# Patient Record
Sex: Female | Born: 1975
Health system: Southern US, Community
[De-identification: ages and names within clinical notes are randomized; demographics above are authoritative.]

## PROBLEM LIST (undated history)

## (undated) DIAGNOSIS — L409 Psoriasis, unspecified: Secondary | ICD-10-CM

## (undated) DIAGNOSIS — E0590 Thyrotoxicosis, unspecified without thyrotoxic crisis or storm: Secondary | ICD-10-CM

## (undated) DIAGNOSIS — E049 Nontoxic goiter, unspecified: Secondary | ICD-10-CM

## (undated) DIAGNOSIS — E559 Vitamin D deficiency, unspecified: Secondary | ICD-10-CM

## (undated) DIAGNOSIS — S92901A Unspecified fracture of right foot, initial encounter for closed fracture: Secondary | ICD-10-CM

## (undated) DIAGNOSIS — O009 Unspecified ectopic pregnancy without intrauterine pregnancy: Secondary | ICD-10-CM

## (undated) DIAGNOSIS — K219 Gastro-esophageal reflux disease without esophagitis: Secondary | ICD-10-CM

## (undated) HISTORY — DX: Unspecified fracture of right foot, initial encounter for closed fracture: S92.901A

## (undated) HISTORY — DX: Vitamin D deficiency, unspecified: E55.9

## (undated) HISTORY — DX: Nontoxic goiter, unspecified: E04.9

## (undated) HISTORY — DX: Psoriasis, unspecified: L40.9

## (undated) HISTORY — PX: LAPAROSCOPY FOR ECTOPIC PREGNANCY: SUR765

## (undated) HISTORY — PX: SALPINGECTOMY: SHX328

---

## 1999-10-29 ENCOUNTER — Emergency Department (HOSPITAL_COMMUNITY): Admission: EM | Admit: 1999-10-29 | Discharge: 1999-10-29 | Payer: Self-pay | Admitting: Emergency Medicine

## 2000-03-17 ENCOUNTER — Inpatient Hospital Stay (HOSPITAL_COMMUNITY): Admission: AD | Admit: 2000-03-17 | Discharge: 2000-03-17 | Payer: Self-pay | Admitting: Obstetrics & Gynecology

## 2000-05-31 ENCOUNTER — Emergency Department (HOSPITAL_COMMUNITY): Admission: EM | Admit: 2000-05-31 | Discharge: 2000-05-31 | Payer: Self-pay | Admitting: Emergency Medicine

## 2000-12-07 ENCOUNTER — Inpatient Hospital Stay (HOSPITAL_COMMUNITY): Admission: AD | Admit: 2000-12-07 | Discharge: 2000-12-07 | Payer: Self-pay | Admitting: Obstetrics

## 2001-02-08 ENCOUNTER — Emergency Department (HOSPITAL_COMMUNITY): Admission: EM | Admit: 2001-02-08 | Discharge: 2001-02-08 | Payer: Self-pay | Admitting: Emergency Medicine

## 2001-03-26 ENCOUNTER — Emergency Department (HOSPITAL_COMMUNITY): Admission: EM | Admit: 2001-03-26 | Discharge: 2001-03-26 | Payer: Self-pay | Admitting: Emergency Medicine

## 2001-03-28 ENCOUNTER — Emergency Department (HOSPITAL_COMMUNITY): Admission: EM | Admit: 2001-03-28 | Discharge: 2001-03-28 | Payer: Self-pay | Admitting: *Deleted

## 2001-03-29 ENCOUNTER — Emergency Department (HOSPITAL_COMMUNITY): Admission: EM | Admit: 2001-03-29 | Discharge: 2001-03-29 | Payer: Self-pay | Admitting: Emergency Medicine

## 2001-03-31 ENCOUNTER — Emergency Department (HOSPITAL_COMMUNITY): Admission: EM | Admit: 2001-03-31 | Discharge: 2001-03-31 | Payer: Self-pay | Admitting: Emergency Medicine

## 2002-10-13 ENCOUNTER — Inpatient Hospital Stay (HOSPITAL_COMMUNITY): Admission: AD | Admit: 2002-10-13 | Discharge: 2002-10-13 | Payer: Self-pay | Admitting: Obstetrics and Gynecology

## 2003-02-21 ENCOUNTER — Emergency Department (HOSPITAL_COMMUNITY): Admission: EM | Admit: 2003-02-21 | Discharge: 2003-02-21 | Payer: Self-pay | Admitting: Emergency Medicine

## 2003-04-09 ENCOUNTER — Inpatient Hospital Stay (HOSPITAL_COMMUNITY): Admission: AD | Admit: 2003-04-09 | Discharge: 2003-04-09 | Payer: Self-pay | Admitting: *Deleted

## 2003-09-21 ENCOUNTER — Inpatient Hospital Stay (HOSPITAL_COMMUNITY): Admission: AD | Admit: 2003-09-21 | Discharge: 2003-09-22 | Payer: Self-pay | Admitting: *Deleted

## 2004-02-23 ENCOUNTER — Inpatient Hospital Stay (HOSPITAL_COMMUNITY): Admission: AD | Admit: 2004-02-23 | Discharge: 2004-02-23 | Payer: Self-pay | Admitting: *Deleted

## 2005-11-30 ENCOUNTER — Emergency Department (HOSPITAL_COMMUNITY): Admission: EM | Admit: 2005-11-30 | Discharge: 2005-11-30 | Payer: Self-pay | Admitting: Family Medicine

## 2006-05-02 ENCOUNTER — Emergency Department (HOSPITAL_COMMUNITY): Admission: EM | Admit: 2006-05-02 | Discharge: 2006-05-02 | Payer: Self-pay | Admitting: Family Medicine

## 2007-08-13 ENCOUNTER — Inpatient Hospital Stay (HOSPITAL_COMMUNITY): Admission: AD | Admit: 2007-08-13 | Discharge: 2007-08-13 | Payer: Self-pay | Admitting: Family Medicine

## 2007-08-16 ENCOUNTER — Ambulatory Visit: Admission: AD | Admit: 2007-08-16 | Discharge: 2007-08-16 | Payer: Self-pay | Admitting: Family Medicine

## 2007-08-16 ENCOUNTER — Encounter (INDEPENDENT_AMBULATORY_CARE_PROVIDER_SITE_OTHER): Payer: Self-pay | Admitting: Obstetrics and Gynecology

## 2007-08-19 ENCOUNTER — Inpatient Hospital Stay (HOSPITAL_COMMUNITY): Admission: AD | Admit: 2007-08-19 | Discharge: 2007-08-19 | Payer: Self-pay | Admitting: Obstetrics and Gynecology

## 2007-09-16 DIAGNOSIS — O009 Unspecified ectopic pregnancy without intrauterine pregnancy: Secondary | ICD-10-CM | POA: Insufficient documentation

## 2007-10-02 ENCOUNTER — Ambulatory Visit: Payer: Self-pay | Admitting: Family Medicine

## 2007-10-02 DIAGNOSIS — K089 Disorder of teeth and supporting structures, unspecified: Secondary | ICD-10-CM | POA: Insufficient documentation

## 2007-12-13 ENCOUNTER — Ambulatory Visit: Payer: Self-pay | Admitting: Family Medicine

## 2007-12-13 ENCOUNTER — Encounter (INDEPENDENT_AMBULATORY_CARE_PROVIDER_SITE_OTHER): Payer: Self-pay | Admitting: Family Medicine

## 2007-12-13 DIAGNOSIS — E049 Nontoxic goiter, unspecified: Secondary | ICD-10-CM | POA: Insufficient documentation

## 2007-12-13 LAB — CONVERTED CEMR LAB
Bilirubin Urine: NEGATIVE
Chlamydia, DNA Probe: NEGATIVE
GC Probe Amp, Genital: NEGATIVE
Glucose, Urine, Semiquant: NEGATIVE
Ketones, urine, test strip: NEGATIVE
Nitrite: NEGATIVE
Specific Gravity, Urine: >=1.03
TSH: 1.538 microintl units/mL (ref 0.350–5.50)
Urobilinogen, UA: NEGATIVE
WBC Urine, dipstick: NEGATIVE
pH: 5

## 2007-12-14 ENCOUNTER — Ambulatory Visit: Payer: Self-pay | Admitting: *Deleted

## 2007-12-15 LAB — CONVERTED CEMR LAB: Pap Smear: NORMAL

## 2007-12-28 ENCOUNTER — Ambulatory Visit: Payer: Self-pay | Admitting: Family Medicine

## 2007-12-29 ENCOUNTER — Emergency Department (HOSPITAL_COMMUNITY): Admission: EM | Admit: 2007-12-29 | Discharge: 2007-12-29 | Payer: Self-pay | Admitting: Emergency Medicine

## 2008-08-12 ENCOUNTER — Ambulatory Visit: Payer: Self-pay | Admitting: Family Medicine

## 2008-10-18 ENCOUNTER — Emergency Department (HOSPITAL_COMMUNITY): Admission: EM | Admit: 2008-10-18 | Discharge: 2008-10-18 | Payer: Self-pay | Admitting: Emergency Medicine

## 2008-10-25 ENCOUNTER — Inpatient Hospital Stay (HOSPITAL_COMMUNITY): Admission: AD | Admit: 2008-10-25 | Discharge: 2008-10-25 | Payer: Self-pay | Admitting: Family Medicine

## 2009-05-05 ENCOUNTER — Emergency Department (HOSPITAL_COMMUNITY): Admission: EM | Admit: 2009-05-05 | Discharge: 2009-05-05 | Payer: Self-pay | Admitting: Emergency Medicine

## 2009-06-09 ENCOUNTER — Emergency Department (HOSPITAL_COMMUNITY): Admission: EM | Admit: 2009-06-09 | Discharge: 2009-06-09 | Payer: Self-pay | Admitting: Family Medicine

## 2009-08-14 ENCOUNTER — Emergency Department (HOSPITAL_COMMUNITY): Admission: EM | Admit: 2009-08-14 | Discharge: 2009-08-14 | Payer: Self-pay | Admitting: Emergency Medicine

## 2009-08-15 ENCOUNTER — Emergency Department (HOSPITAL_COMMUNITY): Admission: EM | Admit: 2009-08-15 | Discharge: 2009-08-15 | Payer: Self-pay | Admitting: Family Medicine

## 2010-06-21 ENCOUNTER — Emergency Department (HOSPITAL_COMMUNITY): Admission: EM | Admit: 2010-06-21 | Discharge: 2010-06-21 | Payer: Self-pay | Admitting: Emergency Medicine

## 2010-10-22 ENCOUNTER — Emergency Department (HOSPITAL_COMMUNITY)
Admission: EM | Admit: 2010-10-22 | Discharge: 2010-10-22 | Payer: Self-pay | Source: Home / Self Care | Admitting: Family Medicine

## 2010-12-15 NOTE — Assessment & Plan Note (Signed)
Summary: PT HAS REACTIONS TO MEDS PER REGINA  / NS  Nurse Visit   Vitals Entered By: Vesta Mixer CMA (December 28, 2007 11:58 AM)             Comments pt went to dental clinic yesterday, they are going to pull her tooth, but she has to take antibx first. pt started amox and vicodin.  Her face broke out in red splotches last night and a little more today and has a small ruptured vessel in her right eye.  Has never had either of these meds before.  Her mom thinks she (mom) may have had an allergy to amox.  Also vicodin not helping her pain  Per Jesse Fall D/C Vicodin and see if rash gets better.  If so we know it is from the vicodin if not may be the amox.  Take tylenol and ibuprofen for pain. Pt in agreement. She will let us know tomorrow how she is doing.     Prior Medications: DESOGEN 0.15-30 MG-MCG  TABS (DESOGESTREL-ETHINYL ESTRADIOL) Take one pill each day. Start first pack  starting the first Sunday after next period Current Allergies: No known allergies     Orders Added: 1)  Est. Patient Nurse visit [facility]    ]

## 2010-12-15 NOTE — Assessment & Plan Note (Signed)
Summary: NEW///ESTAB//KT   Vital Signs:  Patient Profile:   35 Years Old Female LMP:     09/19/2007 Height:     65.25 inches Weight:      220 pounds Temp:     97.3 degrees F oral Pulse rate:   60 / minute Pulse rhythm:   regular Resp:     18 per minute BP sitting:   120 / 80  (right arm) Cuff size:   regular  Pt. in pain?   no  Vitals Entered By: Gaylyn Cheers RN (October 02, 2007 8:32 AM)  Menstrual History: LMP (date): 09/19/2007 LMP - Character: normal Menarche: 13 Menstrual flow: 4 days              Is Patient Diabetic? No  Does patient need assistance? Ambulation Normal     Chief Complaint:  to establish; ectopic preg 11/01 and request dental referral.  History of Present Illness: or dental referral. Front tooth has shifted and is painful to bite down on. Pt had ectopic pregnancy and surgery on 08/16/2007 by Dr.David Rana Snare.Pt doing well by her report.  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 Last MJ use 07/2007.   Risk Factors:  Tobacco use:  current    Year started:  2000    Cigarettes:  Yes    Counseled to quit/cut down tobacco use:  yes Drug use:  yes    Substance:  marijuana    Comments:  stopped in Sept 08 HIV high-risk behavior:  no Caffeine use:  3 drinks per day Alcohol use:  no Exercise:  yes    Times per week:  5    Type:  wt lifting Seatbelt use:  100 % Sun Exposure:  occasionally  Family History Risk Factors:    Family History of MI in females < 20 years old:  yes    Family History of MI in males < 49 years old:  no   Review of Systems      See HPI   Physical Exam  General:     Well-developed,well-nourished,in no acute distress; alert,appropriate and cooperative throughout examination Mouth:     poor dentition.  Needs cleaning.has crack  in tooth. Lungs:     Normal respiratory effort, chest expands symmetrically. Lungs are clear to auscultation, no crackles or wheezes. Heart:     Normal rate and regular rhythm. S1 and S2 normal without gallop, murmur, click, rub or other extra sounds.    Impression & Recommendations:  Problem # 1:  DENTAL PAIN (ICD-525.9) Dental Referral completed.   Patient Instructions: 1)  Please schedule a follow-up appointment as needed.    ]

## 2010-12-15 NOTE — Assessment & Plan Note (Signed)
Summary: Veronica Contreras   Vital Signs:  Patient Profile:   35 Years Old Female LMP:     11/18/2007 Height:     65.25 inches Weight:      224.5 pounds BMI:     37.21 Temp:     97.9 degrees F Pulse rate:   84 / minute Pulse rhythm:   regular Resp:     20 per minute BP sitting:   120 / 80  (left arm) Cuff size:   large  Pt. in pain?   no  Vitals Entered By: Vesta Mixer CMA (December 13, 2007 8:38 AM)  Menstrual History: LMP (date): 11/18/2007              Is Patient Diabetic? No  Does patient need assistance? Ambulation Normal     Chief Complaint:  ccp, stomach has been hurting for the past month or so, and wants to start birth control.  History of Present Illness: Here for CPP. Has long-term female partner x 6 years. Does smoke; uses 1pack in 3 days.Has decreased amount. Has had intermittent upper abdominal discomfort,alternating right to left side.Associated with increased belching and flatus. After her surgery for her ectopic,Pt was told that even her remaining tube was damaged and is she wanted children then she would probably need IVF to conceive. Pt says that both she and her partner are OK without having children. She never really planned on kids. She understands that her ovarian fertile years decreases in her 67's which would make IVF more difficult if not impossible. She understands this info and still wishes to go on BCP's.  Current Allergies: No known allergies   Past Surgical History:    Reviewed history from 10/02/2007 and no changes required:       s/p ectopic pregnancy and surgery 08/16/2007    Risk Factors:     Counseled to quit/cut down tobacco use:  yes   Review of Systems  GU      Denies abnormal vaginal bleeding, decreased libido, discharge, dysuria, genital sores, hematuria, and incontinence.   Physical Exam  General:     Well-developed,well-nourished,in no acute distress; alert,appropriate and cooperative throughout examination Head:  Normocephalic and atraumatic without obvious abnormalities. No apparent alopecia or balding. Eyes:     No corneal or conjunctival inflammation noted. EOMI. Perrla.  Ears:     External ear exam shows no significant lesions or deformities.  Otoscopic examination reveals clear canals, tympanic membranes are intact bilaterally without bulging, retraction, inflammation or discharge. Hearing is grossly normal bilaterally. Nose:     External nasal examination shows no deformity or inflammation. Nasal mucosa are pink and moist without lesions or exudates. Mouth:     Oral mucosa and oropharynx without lesions or exudates.  Teeth in good repair. Neck:     Smooth,enlarged thyroid. Breasts:     No mass, nodules, thickening, tenderness, bulging, retraction, inflamation, nipple discharge or skin changes noted.   Lungs:     Normal respiratory effort, chest expands symmetrically. Lungs are clear to auscultation, no crackles or wheezes. Heart:     Normal rate and regular rhythm. S1 and S2 normal without gallop, murmur, click, rub or other extra sounds. Abdomen:     Bowel sounds positive,abdomen soft and non-tender without masses, organomegaly or hernias noted. Genitalia:     Pelvic Exam:        External: normal female genitalia without lesions or masses        Vagina: normal without lesions or masses  Cervix: normal without lesions or masses        Adnexa: normal bimanual exam without masses or fullness Pap done GC/CHL done wet prep:normal.No trich,clue cells,yeast        Uterus: normal by palpation        Pap smear: performed Msk:     No deformity or scoliosis noted of thoracic or lumbar spine.   Extremities:     No clubbing, cyanosis, edema, or deformity noted with normal full range of motion of all joints.   Neurologic:     No cranial nerve deficits noted.  Psych:     Cognition and judgment appear intact. Alert and cooperative with normal attention span and concentration. No apparent  delusions, illusions, hallucinations    Impression & Recommendations:  Problem # 1:  EXAMINATION, ROUTINE MEDICAL (ICD-V70.0) PAP done STD screening Tdap given  Problem # 2:  GOITER, UNSPECIFIED (ICD-240.9)  Orders: T-TSH ([id number])   Problem # 3:  CONTRACEPTIVE MANAGEMENT (ICD-V25.9) Start Desogen OCP's Risks d/w patient and she is counseled to quit smoking.  Complete Medication List: 1)  Desogen 0.15-30 Mg-mcg Tabs (Desogestrel-ethinyl estradiol) .... Take one pill each day. start first pack  starting the first sunday after next period  Other Orders: T-HIV Antibody  (Reflex) [phone]) T-Syphilis Test (RPR) [phone]) Thin Prep Pap (62952) Pap Smear, Thin Prep ( Collection of) [phone]) KOH/ WET [place] [phone]) T- GC Chlamydia (27253) Tdap => 69yrs IM (66440) Admin 1st Vaccine (34742)   Patient Instructions: 1)  Please schedule a follow-up appointment as needed. 2)  If you could be exposed to sexually transmitted diseases, you should use a condom. 3)  If you are having sex and you or your partner don't want a child, use contraception. 4)  Tobacco is very bad for your health and your loved ones! You Should stop smoking!.    Prescriptions: DESOGEN 0.15-30 MG-MCG  TABS (DESOGESTREL-ETHINYL ESTRADIOL) Take one pill each day. Start first pack  starting the first Sunday after next period  #30 x 12   Entered and Authorized by:   Beverley Fiedler MD   Signed by:   Beverley Fiedler MD on 12/13/2007   Method used:   Print then Give to Patient   RxID:   [id number]  ] Laboratory Results   Urine Tests    Routine Urinalysis   Glucose: negative   (Normal Range: Negative) Bilirubin: negative   (Normal Range: Negative) Ketone: negative   (Normal Range: Negative) Spec. Gravity: >=1.030   (Normal Range: 1.003-1.035) Blood: trace-lysed   (Normal Range: Negative) pH: 5.0   (Normal Range: 5.0-8.0) Protein: trace   (Normal Range: Negative) Urobilinogen: negative    (Normal Range: 0-1) Nitrite: negative   (Normal Range: Negative) Leukocyte Esterace: negative   (Normal Range: Negative)         Tetanus/Td Vaccine    Vaccine Type: Tdap    Site: left deltoid    Mfr: Sanofi Pasteur    Dose: 0.5 ml    Route: IM    Given by: Vesta Mixer CMA    Exp. Date: 12/21/2009    Lot #: [id number]    VIS given: 05/26/05 version given December 13, 2007.
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
## 2011-01-29 LAB — URINALYSIS, ROUTINE W REFLEX MICROSCOPIC
Bilirubin Urine: NEGATIVE
Glucose, UA: NEGATIVE mg/dL
Ketones, ur: NEGATIVE mg/dL
Nitrite: NEGATIVE
Protein, ur: NEGATIVE mg/dL
Specific Gravity, Urine: 1.02 (ref 1.005–1.030)
Urobilinogen, UA: 0.2 mg/dL (ref 0.0–1.0)
pH: 6 (ref 5.0–8.0)

## 2011-01-29 LAB — URINE MICROSCOPIC-ADD ON

## 2011-01-29 LAB — POCT PREGNANCY, URINE: Preg Test, Ur: NEGATIVE

## 2011-02-22 LAB — DIFFERENTIAL
Basophils Absolute: 0 10*3/uL (ref 0.0–0.1)
Basophils Relative: 1 % (ref 0–1)
Eosinophils Absolute: 0.3 10*3/uL (ref 0.0–0.7)
Eosinophils Relative: 6 % — ABNORMAL HIGH (ref 0–5)
Lymphocytes Relative: 38 % (ref 12–46)
Lymphs Abs: 1.8 10*3/uL (ref 0.7–4.0)
Monocytes Absolute: 0.6 10*3/uL (ref 0.1–1.0)
Monocytes Relative: 12 % (ref 3–12)
Neutro Abs: 2.1 10*3/uL (ref 1.7–7.7)
Neutrophils Relative %: 44 % (ref 43–77)

## 2011-02-22 LAB — CBC
HCT: 39.5 % (ref 36.0–46.0)
Hemoglobin: 13.7 g/dL (ref 12.0–15.0)
MCHC: 34.6 g/dL (ref 30.0–36.0)
MCV: 94.8 fL (ref 78.0–100.0)
Platelets: 263 10*3/uL (ref 150–400)
RBC: 4.18 MIL/uL (ref 3.87–5.11)
RDW: 13.7 % (ref 11.5–15.5)
WBC: 4.9 10*3/uL (ref 4.0–10.5)

## 2011-02-22 LAB — COMPREHENSIVE METABOLIC PANEL
ALT: 19 U/L (ref 0–35)
AST: 20 U/L (ref 0–37)
Albumin: 3.6 g/dL (ref 3.5–5.2)
Alkaline Phosphatase: 79 U/L (ref 39–117)
BUN: 8 mg/dL (ref 6–23)
CO2: 25 mEq/L (ref 19–32)
Calcium: 8.7 mg/dL (ref 8.4–10.5)
Chloride: 105 mEq/L (ref 96–112)
Creatinine, Ser: 0.63 mg/dL (ref 0.4–1.2)
GFR calc Af Amer: >60 mL/min (ref >60)
GFR calc non Af Amer: >60 mL/min (ref >60)
Glucose, Bld: 87 mg/dL (ref 70–99)
Potassium: 4.2 mEq/L (ref 3.5–5.1)
Sodium: 137 mEq/L (ref 135–145)
Total Bilirubin: 0.7 mg/dL (ref 0.3–1.2)
Total Protein: 7.1 g/dL (ref 6.0–8.3)

## 2011-03-30 NOTE — Op Note (Signed)
Veronica Contreras, Veronica Contreras              ACCOUNT NO.:  [id number]   MEDICAL RECORD NO.:  [id number]          PATIENT TYPE:  AMB   LOCATION:  [facility]                          FACILITY:  [facility]   PHYSICIAN:  Dineen Kid. Rana Snare, M.D.    DATE OF BIRTH:  11/19/1975   DATE OF PROCEDURE:  08/16/2007  DATE OF DISCHARGE:                               OPERATIVE REPORT   PREOPERATIVE DIAGNOSIS:  Right adnexal ectopic with left hydrosalpinx.   POSTOPERATIVE DIAGNOSIS:  Right adnexal ectopic with left hydrosalpinx,  plus pelvic adhesions.   PROCEDURE:  Laparoscopic right salpingectomy with lysis of adhesions and  ovarian cystectomy.   SURGEON:  Dr. Candice Camp.   ANESTHESIA:  General endotracheal.   INDICATIONS:  Veronica Contreras is a 35 year old G1 who presented to the  emergency room September 28 for evaluation of ectopic, had diagnosed  ectopic pregnancy by ultrasound and quantitative HCG.  She received  methotrexate she returns today for follow-up evaluation, finding the  beta HCG had gone up from 10,000 to 16,000.  We got a follow-up  ultrasound showing fetal heart activity now with a 6-week size ectopic  pregnancy in the right adnexa.  Hemoglobin is stable.  She also had a  left hydrosalpinx on ultrasound.   PLAN:  Laparoscopic evaluation for removal of ectopic pregnancy and  evaluation and possible removal of left fallopian tube due to  hydrosalpinx. Risks and benefits of procedure were discussed at length  which include but are not limited to risk of infection, bleeding, damage  to the uterus, tubes, ovaries, bowel-bladder, risk of future ectopics,  risk of possibly having to remove both fallopian tubes which would leave  her sterile.  At this time she expresses that she had does not want  children.  She does understand the risk of the procedure and does give  her informed consent.   FINDINGS AT TIME OF SURGERY:  Pelvic adhesions from the left fallopian  tube and ovary to the under portion of the uterus  with a hydrosalpinx.  The right ovary showed a moderate-sized corpus luteal cyst with a distal  ampullary ectopic completely encompassing the distal end of the  fallopian tube the ampullary portion and densely adhered to the ovary.  She has otherwise normal-appearing uterus, cul-de-sac, appendix and  liver.   DESCRIPTION OF PROCEDURE:  After adequate analgesia the patient placed  in the dorsal lithotomy position.  She is sterilely prepped and draped.  Bladder sterilely drained.  A Hulka tenaculum was placed into anterior  lip of the cervix.  1 cm infraumbilical skin incision was made.  A  Veress needle was inserted.  The abdomen was insufflated with dullness  to percussion.  11 mm trocar was inserted.  The above findings were  noted by laparoscopic.  A 5 mm trocar was inserted left of the midline  two fingerbreadths above the pubic symphysis.  After careful systematic  evaluation of pelvis, attempts at lysis of adhesions of the left  fallopian tube were attempted first.  We were able to free the fallopian  tube and ovary from the posterior uterine wall and ovarian  fossa and the  distal end of the hydrosalpinx was adhered to the ovary.  Sharp Endo  shears were used to open the ampullary portion of fimbriated end of the  fallopian tube with the release of clear fluid relieving the  hydrosalpinx with decompression of the hydrosalpinx and the fimbriated  end appeared be normal with smaller adhesions adhering the fallopian  tube against the midportion of fallopian tube.  Attempts at relieving  these adhesions were not attempted but because the fallopian tube had  been opened at the fimbriated end and decompressed with the fluid  relieved the left fallopian tube was left alone at this time.  The right  fallopian tube was densely adhered to the right ovary with an ovarian  cyst and the ectopic in the ampullary portion.  After careful evaluation  we were unable to save the distal portion of the  fallopian tube.  Gyrus  cutting forceps were used to dissect and remove the distal ectopic from  the adhesion to the ovary and distal end of fallopian tube and  mesosalpinx with good hemostasis achieved.  Ovarian cystectomy was then  performed retrieving bloody clear fluid and lysis of adhesions carried  out freeing the ovary and fallopian tube from the pelvic sidewall..  The  Nezhat suction irrigator was used to carefully irrigate the pelvis and  cul-de-sac and after thorough evaluation of the right fallopian tube and  ovary, the midportion fallopian tube to the proximal portion appeared to  be normal.  The ovary at this point appeared to be normal and  hemostatic.  The ectopic pregnancy was removed through the 11 mm port.  The left fallopian tube at this point appeared to be hemostatic and  decompressed from the hydrosalpinx with fimbriated end appearing  moderately normal except for small amount of trauma to the fallopian  tube during the lysis of adhesions.  The abdomen was then desufflated  trocars removed.  The infraumbilical skin incision was closed with 0  Vicryl interrupted suture in the fascia, 3-0 Vicryl Rapide subcuticular  suture.  5 mm site was closed with 3-0 Vicryl Rapide subcuticular  suture.  Both incisions were injected 0.25% Marcaine total of 10 mL  used.  Tenaculum was removed from the cervix noted to be hemostatic.  The patient was stable on transfer to recovery.  Sponge instrument count  was normal x3.  Estimated blood loss was minimal.  The patient  discharged home.  Follow-up in the office 2-3 weeks.  Sent home with a  routine instruction sheet for laparoscopy, told to return for increased  pain, fever or bleeding, will keep her out of school for the next 4  days.  Wrote a prescription for Vicodin #30 to take as needed.  Again  told to return for increased pain, fever or bleeding.      Dineen Kid Rana Snare, M.D.  Electronically Signed     DCL/MEDQ  D:  08/16/2007   T:  08/17/2007  Job:  [id number]

## 2011-08-08 ENCOUNTER — Encounter (HOSPITAL_COMMUNITY): Payer: Self-pay

## 2011-08-08 ENCOUNTER — Inpatient Hospital Stay (HOSPITAL_COMMUNITY)
Admission: AD | Admit: 2011-08-08 | Discharge: 2011-08-08 | Disposition: A | Discharge status: Home / Self Care | Payer: Self-pay | Source: Ambulatory Visit | Attending: Obstetrics & Gynecology | Admitting: Obstetrics & Gynecology

## 2011-08-08 DIAGNOSIS — N390 Urinary tract infection, site not specified: Secondary | ICD-10-CM | POA: Insufficient documentation

## 2011-08-08 DIAGNOSIS — A5901 Trichomonal vulvovaginitis: Secondary | ICD-10-CM | POA: Insufficient documentation

## 2011-08-08 DIAGNOSIS — R300 Dysuria: Secondary | ICD-10-CM | POA: Insufficient documentation

## 2011-08-08 DIAGNOSIS — A599 Trichomoniasis, unspecified: Secondary | ICD-10-CM

## 2011-08-08 LAB — URINALYSIS, ROUTINE W REFLEX MICROSCOPIC
Bilirubin Urine: NEGATIVE
Glucose, UA: NEGATIVE mg/dL
Ketones, ur: NEGATIVE mg/dL
Nitrite: NEGATIVE
Protein, ur: NEGATIVE mg/dL
Specific Gravity, Urine: 1.025 (ref 1.005–1.030)
Urobilinogen, UA: 0.2 mg/dL (ref 0.0–1.0)
pH: 6 (ref 5.0–8.0)

## 2011-08-08 LAB — WET PREP, GENITAL
Clue Cells Wet Prep HPF POC: NONE SEEN
Yeast Wet Prep HPF POC: NONE SEEN

## 2011-08-08 LAB — URINE MICROSCOPIC-ADD ON

## 2011-08-08 LAB — POCT PREGNANCY, URINE: Preg Test, Ur: NEGATIVE

## 2011-08-08 MED ORDER — METRONIDAZOLE 500 MG PO TABS: 500.0000 mg | ORAL_TABLET | Freq: Two times a day (BID) | ORAL | Status: AC

## 2011-08-08 MED ORDER — SULFAMETHOXAZOLE-TMP DS 800-160 MG PO TABS: 1.0000 | ORAL_TABLET | Freq: Two times a day (BID) | ORAL | Status: AC

## 2011-08-08 NOTE — ED Provider Notes (Signed)
History     CSN: [id number] Arrival date & time: 08/08/2011  8:58 AM  Chief Complaint  Patient presents with  . Dysuria    HPI  (Consider location/radiation/quality/duration/timing/severity/associated sxs/prior treatment)  HPIAnnette J Contreras  Is a 35 y.o. who c/o stinging, frequency, low pressure. Has had thick clear to yellow vaginal discharge with no odor, has sl itching, no dyspareunia. No other c/o today.  Past Medical History  Diagnosis Date  . No pertinent past medical history     Past Surgical History  Procedure Date  . No past surgeries   . Laparoscopy for ectopic pregnancy     No family history on file.  History  Substance Use Topics  . Smoking status: Never Smoker   . Smokeless tobacco: Not on file  . Alcohol Use: No    OB History    Grav Para Term Preterm Abortions TAB SAB Ect Mult Living   1    1   1   0      Review of Systems  Review of Systems  Constitutional: Negative.   Genitourinary: Positive for dysuria, frequency and difficulty urinating. Negative for urgency, hematuria, flank pain, enuresis and genital sores.    Allergies  Review of patient's allergies indicates no known allergies.  Home Medications  No current outpatient prescriptions on file.  Physical Exam    BP 120/75  Pulse 74  Temp(Src) 98 F (36.7 C) (Oral)  Ht 5' 5.25" (1.657 m)  Wt 255 lb 12.8 oz (116.03 kg)  BMI 42.24 kg/m2  LMP 08/03/2011  Physical Exam  Constitutional: She is oriented to person, place, and time. She appears well-developed and well-nourished.  Abdominal: Soft. There is no tenderness.  Genitourinary:       Ext gen- nl female Vagina- small amt blood cx- closed Uterus- nl size, non tender Adn- nomasses palp, nontender  Musculoskeletal: Normal range of motion.  Neurological: She is alert and oriented to person, place, and time.  Skin: Skin is warm and dry.  Psychiatric: She has a normal mood and affect. Her behavior is normal.    ED Course    Procedures (including critical care time)  Labs Reviewed  URINALYSIS, ROUTINE W REFLEX MICROSCOPIC - Abnormal; Notable for the following:    Appearance HAZY (*)    Hgb urine dipstick LARGE (*)    Leukocytes, UA MODERATE (*)    All other components within normal limits  URINE MICROSCOPIC-ADD ON - Abnormal; Notable for the following:    Squamous Epithelial / LPF FEW (*)    All other components within normal limits  POCT PREGNANCY, URINE  POCT URINE PREGNANCY   Component     Latest Ref Rng 08/08/2011  WBC, Wet Prep HPF POC     NONE SEEN FEW (A)   No results found. Wet prep + Trichomonas   No diagnosis found. Dx: Trichomonas Urinary tract infection   MDM   Rx Flagyl 500 mg #14 BID x 7 d Septra DS #6 BID x 3         Toyia Jelinek M. Kaydyn Chism 08/08/11 1841

## 2011-08-08 NOTE — Progress Notes (Signed)
Onset of stinging with urination since last night, having thick discharge no odor, itching, LMP 9/18

## 2011-08-09 LAB — GC/CHLAMYDIA PROBE AMP, GENITAL
Chlamydia, DNA Probe: NEGATIVE
GC Probe Amp, Genital: NEGATIVE

## 2011-08-19 LAB — GC/CHLAMYDIA PROBE AMP, GENITAL
Chlamydia, DNA Probe: NEGATIVE
GC Probe Amp, Genital: NEGATIVE

## 2011-08-19 LAB — URINALYSIS, ROUTINE W REFLEX MICROSCOPIC
Bilirubin Urine: NEGATIVE
Glucose, UA: NEGATIVE mg/dL
Ketones, ur: NEGATIVE mg/dL
Leukocytes, UA: NEGATIVE
Nitrite: NEGATIVE
Protein, ur: NEGATIVE mg/dL
Specific Gravity, Urine: 1.02 (ref 1.005–1.030)
Urobilinogen, UA: 0.2 mg/dL (ref 0.0–1.0)
pH: 6.5 (ref 5.0–8.0)

## 2011-08-19 LAB — URINE MICROSCOPIC-ADD ON

## 2011-08-19 LAB — WET PREP, GENITAL
Clue Cells Wet Prep HPF POC: NONE SEEN
Trich, Wet Prep: NONE SEEN
Yeast Wet Prep HPF POC: NONE SEEN

## 2011-08-19 LAB — POCT PREGNANCY, URINE: Preg Test, Ur: NEGATIVE

## 2011-08-26 LAB — CBC
HCT: 39.2
HCT: 38.2
Hemoglobin: 13.5
Hemoglobin: 13.4
MCHC: 34.4
MCHC: 35.1
MCV: 96
MCV: 95.5
Platelets: 265
Platelets: 279
RBC: 4.08
RBC: 3.99
RDW: 14.1 — ABNORMAL HIGH
RDW: 14
WBC: 5.7
WBC: 6.2

## 2011-08-26 LAB — DIFFERENTIAL
Basophils Absolute: 0
Basophils Relative: 1
Eosinophils Absolute: 0.4
Eosinophils Relative: 7 — ABNORMAL HIGH
Lymphocytes Relative: 28
Lymphs Abs: 1.7
Monocytes Absolute: 0.8 — ABNORMAL HIGH
Monocytes Relative: 13 — ABNORMAL HIGH
Neutro Abs: 3.2
Neutrophils Relative %: 52

## 2011-08-26 LAB — CREATININE, SERUM
Creatinine, Ser: 0.7
GFR calc Af Amer: >60
GFR calc non Af Amer: >60

## 2011-08-26 LAB — ABO/RH: ABO/RH(D): O POS

## 2011-08-26 LAB — BUN: BUN: 10

## 2011-08-26 LAB — POCT PREGNANCY, URINE
Operator id: 208801
Preg Test, Ur: POSITIVE

## 2011-08-26 LAB — URINALYSIS, ROUTINE W REFLEX MICROSCOPIC
Bilirubin Urine: NEGATIVE
Glucose, UA: NEGATIVE
Ketones, ur: NEGATIVE
Leukocytes, UA: NEGATIVE
Nitrite: NEGATIVE
Protein, ur: NEGATIVE
Specific Gravity, Urine: >1.03 — ABNORMAL HIGH
Urobilinogen, UA: 0.2
pH: 5.5

## 2011-08-26 LAB — HCG, QUANTITATIVE, PREGNANCY
hCG, Beta Chain, Quant, S: 16475 — ABNORMAL HIGH
hCG, Beta Chain, Quant, S: 10339 — ABNORMAL HIGH

## 2011-08-26 LAB — WET PREP, GENITAL
Clue Cells Wet Prep HPF POC: NONE SEEN
Trich, Wet Prep: NONE SEEN
Yeast Wet Prep HPF POC: NONE SEEN

## 2011-08-26 LAB — GC/CHLAMYDIA PROBE AMP, GENITAL
Chlamydia, DNA Probe: NEGATIVE
GC Probe Amp, Genital: NEGATIVE

## 2011-08-26 LAB — URINE MICROSCOPIC-ADD ON

## 2011-08-26 LAB — AST: AST: 18

## 2011-10-03 ENCOUNTER — Inpatient Hospital Stay (HOSPITAL_COMMUNITY)
Admission: AD | Admit: 2011-10-03 | Discharge: 2011-10-03 | Disposition: A | Discharge status: Home / Self Care | Payer: Self-pay | Source: Ambulatory Visit | Attending: Obstetrics & Gynecology | Admitting: Obstetrics & Gynecology

## 2011-10-03 DIAGNOSIS — N898 Other specified noninflammatory disorders of vagina: Secondary | ICD-10-CM | POA: Insufficient documentation

## 2011-10-03 LAB — WET PREP, GENITAL
Clue Cells Wet Prep HPF POC: NONE SEEN
Trich, Wet Prep: NONE SEEN
Yeast Wet Prep HPF POC: NONE SEEN

## 2011-10-03 LAB — URINALYSIS, ROUTINE W REFLEX MICROSCOPIC
Bilirubin Urine: NEGATIVE
Glucose, UA: NEGATIVE mg/dL
Hgb urine dipstick: NEGATIVE
Ketones, ur: NEGATIVE mg/dL
Leukocytes, UA: NEGATIVE
Nitrite: NEGATIVE
Protein, ur: NEGATIVE mg/dL
Specific Gravity, Urine: 1.02 (ref 1.005–1.030)
Urobilinogen, UA: 0.2 mg/dL (ref 0.0–1.0)
pH: 6 (ref 5.0–8.0)

## 2011-10-03 LAB — POCT PREGNANCY, URINE: Preg Test, Ur: NEGATIVE

## 2011-10-03 NOTE — Progress Notes (Signed)
[facility] Monday had yellow discharge was told has yeast infection, now 7 days later still uncomfortable, has been using Monistat not helped, was tested for STDs there was told no sign, wants to be checked again, LMP 08/30/11.

## 2011-10-03 NOTE — ED Provider Notes (Signed)
Agree with above note.  Veronica Contreras H. 10/03/2011 12:40 PM

## 2011-10-03 NOTE — ED Provider Notes (Signed)
History     Chief Complaint  Patient presents with  . Vaginal Discharge   HPI 35 y.o. G1P0010 with vaginal discharge and irritation. [facility] and diagnosed with yeast, doing 7 day course of monistat with no improvement. Was told all other tests were normal, but would like repeat testing today.     Past Medical History  Diagnosis Date  . No pertinent past medical history     Past Surgical History  Procedure Date  . No past surgeries   . Laparoscopy for ectopic pregnancy     No family history on file.  History  Substance Use Topics  . Smoking status: Never Smoker   . Smokeless tobacco: Not on file  . Alcohol Use: No    Allergies: No Known Allergies  Prescriptions prior to admission  Medication Sig Dispense Refill  . ibuprofen (ADVIL,MOTRIN) 200 MG tablet Take 400 mg by mouth every 6 (six) hours as needed. Patient takes for headaches         Review of Systems  Constitutional: Negative.   Respiratory: Negative.   Cardiovascular: Negative.   Gastrointestinal: Negative for nausea, vomiting, abdominal pain, diarrhea and constipation.  Genitourinary: Negative for dysuria, urgency, frequency, hematuria and flank pain.       Positive for vaginal discharge and irritation   Musculoskeletal: Negative.   Neurological: Negative.   Psychiatric/Behavioral: Negative.    Physical Exam   Blood pressure 114/64, pulse 71, temperature 98.9 F (37.2 C), temperature source Oral, height 5' 5.25" (1.657 m), weight 114.851 kg (253 lb 3.2 oz), last menstrual period 08/30/2011.  Physical Exam  Nursing note and vitals reviewed. Constitutional: She is oriented to person, place, and time. She appears well-developed and well-nourished. No distress.  HENT:  Head: Normocephalic and atraumatic.  Cardiovascular: Normal rate, regular rhythm and normal heart sounds.   Respiratory: Effort normal and breath sounds normal. No respiratory distress.  GI: Soft. Bowel sounds  are normal. She exhibits no distension and no mass. There is no tenderness. There is no rebound and no guarding.  Genitourinary: There is no rash or lesion on the right labia. There is no rash or lesion on the left labia. Uterus is not deviated, not enlarged, not fixed and not tender. Cervix exhibits no motion tenderness, no discharge and no friability. Right adnexum displays no mass, no tenderness and no fullness. Left adnexum displays no mass, no tenderness and no fullness. No erythema, tenderness or bleeding around the vagina. Vaginal discharge (creamy white and mucous) found.  Musculoskeletal: Normal range of motion.  Neurological: She is alert and oriented to person, place, and time.  Skin: Skin is warm and dry.  Psychiatric: She has a normal mood and affect.    MAU Course  Procedures  Results for orders placed during the hospital encounter of 10/03/11 (from the past 24 hour(s))  URINALYSIS, ROUTINE W REFLEX MICROSCOPIC     Status: Abnormal   Collection Time   10/03/11  8:50 AM      Component Value Range   Color, Urine YELLOW  YELLOW    Appearance HAZY (*) CLEAR    Specific Gravity, Urine 1.020  1.005 - 1.030    pH 6.0  5.0 - 8.0    Glucose, UA NEGATIVE  NEGATIVE (mg/dL)   Hgb urine dipstick NEGATIVE  NEGATIVE    Bilirubin Urine NEGATIVE  NEGATIVE    Ketones, ur NEGATIVE  NEGATIVE (mg/dL)   Protein, ur NEGATIVE  NEGATIVE (mg/dL)   Urobilinogen,  UA 0.2  0.0 - 1.0 (mg/dL)   Nitrite NEGATIVE  NEGATIVE    Leukocytes, UA NEGATIVE  NEGATIVE   POCT PREGNANCY, URINE     Status: Normal   Collection Time   10/03/11  9:02 AM      Component Value Range   Preg Test, Ur NEGATIVE    WET PREP, GENITAL     Status: Abnormal   Collection Time   10/03/11  9:10 AM      Component Value Range   Yeast, Wet Prep NONE SEEN  NONE SEEN    Trich, Wet Prep NONE SEEN  NONE SEEN    Clue Cells, Wet Prep NONE SEEN  NONE SEEN    WBC, Wet Prep HPF POC RARE (*) NONE SEEN      Assessment and Plan    Vaginal discharge - wet prep negative, GC/CT pending Try RepHresh, rev'd hygiene measures, F/U at [facility] if symptoms do not improve  Dovey Fatzinger 10/03/2011, 8:54 AM

## 2011-10-04 LAB — GC/CHLAMYDIA PROBE AMP, GENITAL
Chlamydia, DNA Probe: NEGATIVE
GC Probe Amp, Genital: NEGATIVE

## 2011-10-09 ENCOUNTER — Emergency Department (INDEPENDENT_AMBULATORY_CARE_PROVIDER_SITE_OTHER)
Admission: EM | Admit: 2011-10-09 | Discharge: 2011-10-09 | Disposition: A | Discharge status: Home / Self Care | Payer: Self-pay | Source: Home / Self Care | Attending: Family Medicine | Admitting: Family Medicine

## 2011-10-09 ENCOUNTER — Encounter (HOSPITAL_COMMUNITY): Payer: Self-pay

## 2011-10-09 ENCOUNTER — Emergency Department (INDEPENDENT_AMBULATORY_CARE_PROVIDER_SITE_OTHER): Payer: Self-pay

## 2011-10-09 DIAGNOSIS — S40019A Contusion of unspecified shoulder, initial encounter: Secondary | ICD-10-CM

## 2011-10-09 DIAGNOSIS — S335XXA Sprain of ligaments of lumbar spine, initial encounter: Secondary | ICD-10-CM

## 2011-10-09 DIAGNOSIS — S39012A Strain of muscle, fascia and tendon of lower back, initial encounter: Secondary | ICD-10-CM

## 2011-10-09 HISTORY — DX: Gastro-esophageal reflux disease without esophagitis: K21.9

## 2011-10-09 MED ORDER — IBUPROFEN 800 MG PO TABS
800.0000 mg | ORAL_TABLET | Freq: Once | ORAL | Status: AC
  Administered 2011-10-09: 800 mg via ORAL

## 2011-10-09 MED ORDER — IBUPROFEN 800 MG PO TABS
ORAL_TABLET | ORAL | Status: AC
  Filled 2011-10-09: qty 1

## 2011-10-09 MED ORDER — CYCLOBENZAPRINE HCL 5 MG PO TABS: 5.0000 mg | ORAL_TABLET | Freq: Three times a day (TID) | ORAL | Status: AC | PRN

## 2011-10-09 MED ORDER — IBUPROFEN 800 MG PO TABS: 800.0000 mg | ORAL_TABLET | Freq: Three times a day (TID) | ORAL | Status: AC | PRN
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
## 2011-10-09 NOTE — ED Provider Notes (Signed)
Medical screening examination/treatment/procedure(s) were performed by non-physician practitioner and as supervising physician I was immediately available for consultation/collaboration.   Nile Prisk DOUGLAS MD.    Jakai Onofre Douglas Riaan [place], MD 10/09/11 2003 
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
## 2011-11-23 ENCOUNTER — Emergency Department (HOSPITAL_COMMUNITY)
Admission: EM | Admit: 2011-11-23 | Discharge: 2011-11-23 | Disposition: A | Discharge status: Home / Self Care | Payer: Self-pay | Attending: Emergency Medicine | Admitting: Emergency Medicine

## 2011-11-23 ENCOUNTER — Encounter (HOSPITAL_COMMUNITY): Payer: Self-pay | Admitting: Emergency Medicine

## 2011-11-23 DIAGNOSIS — J3489 Other specified disorders of nose and nasal sinuses: Secondary | ICD-10-CM | POA: Insufficient documentation

## 2011-11-23 DIAGNOSIS — J069 Acute upper respiratory infection, unspecified: Secondary | ICD-10-CM | POA: Insufficient documentation

## 2011-11-23 DIAGNOSIS — K219 Gastro-esophageal reflux disease without esophagitis: Secondary | ICD-10-CM | POA: Insufficient documentation

## 2011-11-23 DIAGNOSIS — R05 Cough: Secondary | ICD-10-CM | POA: Insufficient documentation

## 2011-11-23 DIAGNOSIS — E669 Obesity, unspecified: Secondary | ICD-10-CM | POA: Insufficient documentation

## 2011-11-23 DIAGNOSIS — R070 Pain in throat: Secondary | ICD-10-CM | POA: Insufficient documentation

## 2011-11-23 DIAGNOSIS — R1110 Vomiting, unspecified: Secondary | ICD-10-CM | POA: Insufficient documentation

## 2011-11-23 DIAGNOSIS — R059 Cough, unspecified: Secondary | ICD-10-CM | POA: Insufficient documentation

## 2011-11-23 DIAGNOSIS — Z79899 Other long term (current) drug therapy: Secondary | ICD-10-CM | POA: Insufficient documentation

## 2011-11-23 DIAGNOSIS — R0989 Other specified symptoms and signs involving the circulatory and respiratory systems: Secondary | ICD-10-CM | POA: Insufficient documentation

## 2011-11-23 MED ORDER — FLUTICASONE PROPIONATE 50 MCG/ACT NA SUSP: 2.0000 | Freq: Every day | NASAL | Status: DC

## 2011-11-23 NOTE — ED Provider Notes (Signed)
Medical screening examination/treatment/procedure(s) were performed by non-physician practitioner and as supervising physician I was immediately available for consultation/collaboration.  Fermin Yan, MD 11/23/11 0720 

## 2011-11-23 NOTE — ED Notes (Signed)
Pt alert, nad, c/o cough, uri, hoarseness in throat, resp even unlabored, skin pwd

## 2011-11-23 NOTE — ED Notes (Signed)
Pt st's she has had a cough, runny nose, sore throat since Tuesday.  Also st's her ears are aching.  St's she hasn't been running a fever.  No abdominal pain, complains of some chest pain but associates it with the cough, no SOB.

## 2011-11-23 NOTE — ED Provider Notes (Signed)
History     CSN: [id number]  Arrival date & time 11/23/11  0516   First MD Initiated Contact with Patient 11/23/11 [phone]      Chief Complaint  Patient presents with  . Cough  . URI    (Consider location/radiation/quality/duration/timing/severity/associated sxs/prior treatment) HPI  Review female presenting chief complaint sore throat. She states for the past 5-7 days she has been having runny nose, nasal congestion, throat irritation, chest congestions, and dry cough. She vomited one time but denies diarrhea. Patient denies fever, headache, neck pain, chest pain, abdominal pain, dysuria, rash. She has tried over-the-counter NyQuil and Mucinex without adequate relief. Patient denies change in voice, trouble swallowing, or dental pain.  She has been able eat and drink without difficulty.  Past Medical History  Diagnosis Date  . No pertinent past medical history   . Acid reflux     Past Surgical History  Procedure Date  . No past surgeries   . Laparoscopy for ectopic pregnancy     No family history on file.  History  Substance Use Topics  . Smoking status: Never Smoker   . Smokeless tobacco: Not on file  . Alcohol Use: No    OB History    Grav Para Term Preterm Abortions TAB SAB Ect Mult Living   1    1   1   0      Review of Systems  All other systems reviewed and are negative.    Allergies  Vicodin  Home Medications   Current Outpatient Rx  Name Route Sig Dispense Refill  . LANSOPRAZOLE 15 MG PO CPDR Oral Take 15 mg by mouth daily as needed. For heartburn.       BP 142/80  Pulse 75  Temp(Src) 98 F (36.7 C) (Oral)  Resp 16  Wt 250 lb (113.399 kg)  SpO2 100%  LMP 10/30/2011  Physical Exam  Nursing note and vitals reviewed. Constitutional: She appears well-developed and well-nourished.       Awake, alert, nontoxic appearance.  Obese  HENT:  Head: Atraumatic.  Right Ear: External ear normal.  Left Ear: External ear normal.  Nose: Rhinorrhea  present.  Mouth/Throat: Oropharynx is clear and moist. No oropharyngeal exudate.  Eyes: Right eye exhibits no discharge. Left eye exhibits no discharge.  Neck: Neck supple.  Cardiovascular: Normal rate.   Pulmonary/Chest: Effort normal. No respiratory distress. She has no wheezes. She has no rales. She exhibits no tenderness.  Abdominal: There is no tenderness. There is no rebound.  Musculoskeletal: She exhibits no tenderness.       Baseline ROM, no obvious new focal weakness  Neurological:       Mental status and motor strength appears baseline for patient and situation  Skin: No rash noted.  Psychiatric: She has a normal mood and affect.    ED Course  Procedures (including critical care time)  Labs Reviewed - No data to display No results found.   No diagnosis found.    MDM  URI sxs.  Pt is afebrile, no evidence of tonsilar enlargement, erythema or peritonsilar abscess on exam.  Will d/c with flonase for nasal congestion.          Fayrene Helper, [place] 11/23/11 [phone]

## 2012-02-15 ENCOUNTER — Inpatient Hospital Stay (HOSPITAL_COMMUNITY)
Admission: AD | Admit: 2012-02-15 | Discharge: 2012-02-15 | Disposition: A | Discharge status: Home / Self Care | Payer: Self-pay | Attending: Obstetrics & Gynecology | Admitting: Obstetrics & Gynecology

## 2012-02-15 ENCOUNTER — Encounter (HOSPITAL_COMMUNITY): Payer: Self-pay | Admitting: *Deleted

## 2012-02-15 DIAGNOSIS — N390 Urinary tract infection, site not specified: Secondary | ICD-10-CM | POA: Insufficient documentation

## 2012-02-15 DIAGNOSIS — R350 Frequency of micturition: Secondary | ICD-10-CM | POA: Insufficient documentation

## 2012-02-15 LAB — URINALYSIS, ROUTINE W REFLEX MICROSCOPIC
Bilirubin Urine: NEGATIVE
Glucose, UA: NEGATIVE mg/dL
Ketones, ur: NEGATIVE mg/dL
Leukocytes, UA: NEGATIVE
Nitrite: NEGATIVE
Protein, ur: NEGATIVE mg/dL
Specific Gravity, Urine: >1.03 — ABNORMAL HIGH (ref 1.005–1.030)
Urobilinogen, UA: 0.2 mg/dL (ref 0.0–1.0)
pH: 6 (ref 5.0–8.0)

## 2012-02-15 LAB — URINE MICROSCOPIC-ADD ON

## 2012-02-15 LAB — POCT PREGNANCY, URINE: Preg Test, Ur: NEGATIVE

## 2012-02-15 MED ORDER — SULFAMETHOXAZOLE-TRIMETHOPRIM 800-160 MG PO TABS: 1.0000 | ORAL_TABLET | Freq: Two times a day (BID) | ORAL | Status: AC

## 2012-02-15 NOTE — MAU Note (Signed)
Pt reports since yesterday she has had frequency and pressure with urination. Denies fever. LMP 02/04/2012.

## 2012-02-15 NOTE — MAU Provider Note (Signed)
History     CSN: [id number]  Arrival date and time: 02/15/12 4098   First Provider Initiated Contact with Patient 02/15/12 0302      Chief Complaint  Patient presents with  . Urinary Frequency   HPI This is a 36 y.o. nonpregnant female who presents with c/o urinary frequency and pressure when she voids.  States this is just like the last UTI she had. Has had these symptoms for several days, worse tonight. No fever or flank pain.  OB History    Grav Para Term Preterm Abortions TAB SAB Ect Mult Living   1    1   1   0      Past Medical History  Diagnosis Date  . No pertinent past medical history   . Acid reflux     Past Surgical History  Procedure Date  . No past surgeries   . Laparoscopy for ectopic pregnancy     History reviewed. No pertinent family history.  History  Substance Use Topics  . Smoking status: Never Smoker   . Smokeless tobacco: Not on file  . Alcohol Use: No    Allergies:  Allergies  Allergen Reactions  . Vicodin (Hydrocodone-Acetaminophen) Nausea And Vomiting    Prescriptions prior to admission  Medication Sig Dispense Refill  . lansoprazole (PREVACID) 15 MG capsule Take 15 mg by mouth daily as needed. For heartburn.         ROS ROS as above in HPI.   Physical Exam   Blood pressure 133/63, pulse 78, temperature 98.4 F (36.9 C), resp. rate 20, height 5\' 5"  (1.651 m), weight 250 lb (113.399 kg), last menstrual period 02/04/2012.  Physical Exam  Constitutional: She is oriented to person, place, and time. She appears well-developed and well-nourished. No distress.  HENT:  Head: Normocephalic.  Respiratory: Effort normal.  GI: Soft. She exhibits no distension and no mass. There is no tenderness. There is no rebound and no guarding.       No CVAT  Genitourinary:       Declines pelvic exam  Musculoskeletal: Normal range of motion.  Neurological: She is alert and oriented to person, place, and time.  Skin: Skin is warm and dry.    Psychiatric: She has a normal mood and affect.   Results for orders placed during the hospital encounter of 02/15/12 (from the past 24 hour(s))  URINALYSIS, ROUTINE W REFLEX MICROSCOPIC     Status: Abnormal   Collection Time   02/15/12  2:14 AM      Component Value Range   Color, Urine YELLOW  YELLOW    APPearance CLEAR  CLEAR    Specific Gravity, Urine >1.030 (*) 1.005 - 1.030    pH 6.0  5.0 - 8.0    Glucose, UA NEGATIVE  NEGATIVE (mg/dL)   Hgb urine dipstick MODERATE (*) NEGATIVE    Bilirubin Urine NEGATIVE  NEGATIVE    Ketones, ur NEGATIVE  NEGATIVE (mg/dL)   Protein, ur NEGATIVE  NEGATIVE (mg/dL)   Urobilinogen, UA 0.2  0.0 - 1.0 (mg/dL)   Nitrite NEGATIVE  NEGATIVE    Leukocytes, UA NEGATIVE  NEGATIVE   URINE MICROSCOPIC-ADD ON     Status: Abnormal   Collection Time   02/15/12  2:14 AM      Component Value Range   Squamous Epithelial / LPF MANY (*) RARE    WBC, UA 3-6  <3 (WBC/hpf)   RBC / HPF 3-6  <3 (RBC/hpf)   Bacteria, UA MANY (*) RARE  Urine-Other MUCOUS PRESENT    POCT PREGNANCY, URINE     Status: Normal   Collection Time   02/15/12  2:26 AM      Component Value Range   Preg Test, Ur NEGATIVE  NEGATIVE     MAU Course  Procedures  Assessment and Plan  A:  Probable UTI  P:  Urine to culture       Rx Septra DS x 3 days   [facility] 02/15/2012, 3:06 AM

## 2012-02-15 NOTE — Discharge Instructions (Signed)

## 2012-02-15 NOTE — MAU Provider Note (Signed)
Attestation of Attending Supervision of Advanced Practitioner: Evaluation and management procedures were performed by the PA/NP/CNM/OB Fellow under my supervision/collaboration. Chart reviewed, and agree with management and plan.  Jaynie Collins, M.D. 02/15/2012 3:37 AM

## 2012-02-15 NOTE — MAU Note (Signed)
M. Williams, CNM at bedside.  Assessment done and poc discussed with pt.   

## 2012-02-16 LAB — URINE CULTURE: Culture  Setup Time: 201304021355

## 2012-05-04 ENCOUNTER — Encounter (HOSPITAL_COMMUNITY): Payer: Self-pay | Admitting: *Deleted

## 2012-05-04 ENCOUNTER — Inpatient Hospital Stay (HOSPITAL_COMMUNITY)
Admission: AD | Admit: 2012-05-04 | Discharge: 2012-05-04 | Disposition: A | Discharge status: Home / Self Care | Payer: Self-pay | Source: Ambulatory Visit | Attending: Obstetrics & Gynecology | Admitting: Obstetrics & Gynecology

## 2012-05-04 DIAGNOSIS — A599 Trichomoniasis, unspecified: Secondary | ICD-10-CM

## 2012-05-04 DIAGNOSIS — A5901 Trichomonal vulvovaginitis: Secondary | ICD-10-CM | POA: Insufficient documentation

## 2012-05-04 DIAGNOSIS — L293 Anogenital pruritus, unspecified: Secondary | ICD-10-CM | POA: Insufficient documentation

## 2012-05-04 LAB — URINALYSIS, ROUTINE W REFLEX MICROSCOPIC
Bilirubin Urine: NEGATIVE
Glucose, UA: NEGATIVE mg/dL
Ketones, ur: NEGATIVE mg/dL
Nitrite: NEGATIVE
Protein, ur: NEGATIVE mg/dL
Specific Gravity, Urine: >1.03 — ABNORMAL HIGH (ref 1.005–1.030)
Urobilinogen, UA: 0.2 mg/dL (ref 0.0–1.0)
pH: 5.5 (ref 5.0–8.0)

## 2012-05-04 LAB — WET PREP, GENITAL
Clue Cells Wet Prep HPF POC: NONE SEEN
Yeast Wet Prep HPF POC: NONE SEEN

## 2012-05-04 LAB — POCT PREGNANCY, URINE: Preg Test, Ur: NEGATIVE

## 2012-05-04 LAB — URINE MICROSCOPIC-ADD ON

## 2012-05-04 MED ORDER — METRONIDAZOLE 500 MG PO TABS
2000.0000 mg | ORAL_TABLET | Freq: Once | ORAL | Status: AC
  Administered 2012-05-04: 2000 mg via ORAL
  Filled 2012-05-04: qty 4

## 2012-05-04 NOTE — MAU Provider Note (Signed)
Chief Complaint:  Vaginal Itching    First Provider Initiated Contact with Patient 05/04/12 0429      Veronica Contreras is  36 y.o. G1P0010.  Patient's last menstrual period was 04/07/2012..  She presents complaining of Vaginal Itching . Onset is described as ongoing and has been present for  1.5 weeks. Reports no relief with the use of OTC Monistat.  Obstetrical/Gynecological History: OB History    Grav Para Term Preterm Abortions TAB SAB Ect Mult Living   1    1   1   0      Past Medical History: Past Medical History  Diagnosis Date  . No pertinent past medical history   . Acid reflux     Past Surgical History: Past Surgical History  Procedure Date  . No past surgeries   . Laparoscopy for ectopic pregnancy     Family History: History reviewed. No pertinent family history.  Social History: History  Substance Use Topics  . Smoking status: Never Smoker   . Smokeless tobacco: Not on file  . Alcohol Use: No    Allergies:  Allergies  Allergen Reactions  . Vicodin (Hydrocodone-Acetaminophen) Nausea And Vomiting    Prescriptions prior to admission  Medication Sig Dispense Refill  . lansoprazole (PREVACID) 15 MG capsule Take 15 mg by mouth daily as needed. For heartburn.         Review of Systems - History obtained from the patient General ROS: negative for - chills, fatigue or fever Respiratory ROS: no cough, shortness of breath, or wheezing Cardiovascular ROS: no chest pain or dyspnea on exertion Gastrointestinal ROS: no abdominal pain, change in bowel habits, or black or bloody stools Genito-Urinary ROS: no dysuria, trouble voiding, or hematuria positive for - genital discharge and vulvar/vaginal symptoms Neurological ROS: no TIA or stroke symptoms negative for - headaches  Physical Exam   Blood pressure 129/75, pulse 63, temperature 98.7 F (37.1 C), temperature source Oral, resp. rate 20, height 5' 5.5" (1.664 m), weight 251 lb (113.853 kg), last  menstrual period 04/07/2012, SpO2 100.00%.  General: General appearance - alert, well appearing, and in no distress, oriented to person, place, and time and overweight Mental status - alert, oriented to person, place, and time, normal mood, behavior, speech, dress, motor activity, and thought processes, affect appropriate to mood Abdomen - soft, nontender, nondistended, no masses or organomegaly Focused Gynecological Exam: VULVA: normal appearing vulva with no masses, tenderness or lesions, VAGINA: vaginal discharge - creamy and yellow, CERVIX: normal appearing cervix without discharge or lesions, friable with GC/Chl collection, UTERUS: uterus is normal size, shape, consistency and nontender, ADNEXA: normal adnexa in size, nontender and no masses  Labs: Recent Results (from the past 24 hour(s))  WET PREP, GENITAL   Collection Time   05/04/12  3:55 AM      Component Value Range   Yeast Wet Prep HPF POC NONE SEEN  NONE SEEN   Trich, Wet Prep FEW (*) NONE SEEN   Clue Cells Wet Prep HPF POC NONE SEEN  NONE SEEN   WBC, Wet Prep HPF POC MANY (*) NONE SEEN    Assessment: 1. Trichomonas      Plan: Treated with flagyl in MAU Partner tx referral Safe sex education  Detrick Dani E. 05/04/2012,4:56 AM

## 2012-05-04 NOTE — MAU Note (Signed)
Pt states she has had vaginal itching and vaginal discharge that is yellow x 1 week, used Monostat with any relief in symptoms. LMP 04/07/2012

## 2012-05-04 NOTE — Discharge Instructions (Signed)
Trichomoniasis Trichomoniasis is an infection, caused by the Trichomonas organism, that affects both women and men. In women, the outer female genitalia and the vagina are affected. In men, the penis is mainly affected, but the prostate and other reproductive organs can also be involved. Trichomoniasis is a sexually transmitted disease (STD) and is most often passed to another person through sexual contact. The majority of people who get trichomoniasis do so from a sexual encounter and are also at risk for other STDs. CAUSES   Sexual intercourse with an infected partner.   It can be present in swimming pools or hot tubs.  SYMPTOMS   Abnormal gray-green frothy vaginal discharge in women.   Vaginal itching and irritation in women.   Itching and irritation of the area outside the vagina in women.   Penile discharge with or without pain in males.   Inflammation of the urethra (urethritis), causing painful urination.   Bleeding after sexual intercourse.  RELATED COMPLICATIONS  Pelvic inflammatory disease.   Infection of the uterus (endometritis).   Infertility.   Tubal (ectopic) pregnancy.   It can be associated with other STDs, including gonorrhea and chlamydia, hepatitis B, and HIV.  COMPLICATIONS DURING PREGNANCY  Early (premature) delivery.   Premature rupture of the membranes (PROM).   Low birth weight.  DIAGNOSIS   Visualization of Trichomonas under the microscope from the vagina discharge.   Ph of the vagina greater than 4.5, tested with a test tape.   Trich Rapid Test.   Culture of the organism, but this is not usually needed.   It may be found on a Pap test.   Having a "strawberry cervix,"which means the cervix looks very red like a strawberry.  TREATMENT   You may be given medication to fight the infection. Inform your caregiver if you could be or are pregnant. Some medications used to treat the infection should not be taken during pregnancy.    Over-the-counter medications or creams to decrease itching or irritation may be recommended.   Your sexual partner will need to be treated if infected.  HOME CARE INSTRUCTIONS   Take all medication prescribed by your caregiver.   Take over-the-counter medication for itching or irritation as directed by your caregiver.   Do not have sexual intercourse while you have the infection.   Do not douche or wear tampons.   Discuss your infection with your partner, as your partner may have acquired the infection from you. Or, your partner may have been the person who transmitted the infection to you.   Have your sex partner examined and treated if necessary.   Practice safe, informed, and protected sex.   See your caregiver for other STD testing.  SEEK MEDICAL CARE IF:   You still have symptoms after you finish the medication.   You have an oral temperature above 102 F (38.9 C).   You develop belly (abdominal) pain.   You have pain when you urinate.   You have bleeding after sexual intercourse.   You develop a rash.   The medication makes you sick or makes you throw up (vomit).  Document Released: 04/27/2001 Document Revised: 10/21/2011 Document Reviewed: 05/23/2009 [facility] Patient Information 2012 [place], [place].Safer Sex Your caregiver wants you to have this information about the infections that can be transmitted from sexual contact and how to prevent them. The idea behind safer sex is that you can be sexually active, and at the same time reduce the risk of giving or getting a sexually transmitted  disease (STD). Every person should be aware of how to prevent him or herself and his or her sex partner from getting an STD. CAUSES OF STDS STDs are transmitted by sharing body fluids, which contain viruses and bacteria. The following fluids all transmit infections during sexual intercourse and sex acts:  Semen.   Saliva.   Urine.   Blood.   Vaginal mucus.  Examples of  STDs include:  Chlamydia.   Gonorrhea.   Genital herpes.   Hepatitis B.   Human immunodeficiency virus or acquired immunodeficiency syndrome (HIV or AIDS).   Syphilis.   Trichomonas.   Pubic lice.   Human papillomavirus (HPV), which may include:   Genital warts.   Cervical dysplasia.   Cervical cancer (can develop with certain types of HPV).  SYMPTOMS  Sexual diseases often cause few or no symptoms until they are advanced, so a person can be infected and spread the infection without knowing it. Some STDs respond to treatment very well. Others, like HIV and herpes, cannot be cured, but are treated to reduce their effects. Specific symptoms include:  Abnormal vaginal discharge.   Irritation or itching in and around the vagina, and in the pubic hair.   Pain during sexual intercourse.   Bleeding during sexual intercourse.   Pelvic or abdominal pain.   Fever.   Growths in and around the vagina.   An ulcer in or around the vagina.   Swollen glands in the groin area.  DIAGNOSIS   Blood tests.   Pap test.   Culture test of abnormal vaginal discharge.   A test that applies a solution and examines the cervix with a lighted magnifying scope (colposcopy).   A test that examines the pelvis with a lighted tube, through a small incision (laparoscopy).  TREATMENT  The treatment will depend on the cause of the STD.  Antibiotic treatment by injection, oral, creams, or suppositories in the vagina.   Over-the-counter medicated shampoo, to get rid of pubic lice.   Removing or treating growths with medicine, freezing, burning (electrocautery), or surgery.   Surgery treatment for HPV of the cervix.   Supportive medicines for herpes, HIV, AIDS, and hepatitis.  Being careful cannot eliminate all risk of infection, but sex can be made much safer. Safe sexual practices include body massage and gentle touching. Masturbation is safe, as long as body fluids do not contact skin  that has sores or cuts. Dry kissing and oral sex on a man wearing a latex condom or on a woman wearing a female condom is also safe. Slightly less safe is intercourse while the man wears a latex condom or wet kissing. It is also safer to have one sex partner that you know is not having sex with anyone else. LENGTH OF ILLNESS An STD might be treated and cured in a week, sometimes a month, or more. And it can linger with symptoms for many years. STDs can also cause damage to the female organs. This can cause chronic pain, infertility, and recurrence of the STD, especially herpes, hepatitis, HIV, and HPV. HOME CARE INSTRUCTIONS AND PREVENTION  Alcohol and recreational drugs are often the reason given for not practicing safer sex. These substances affect your judgment. Alcohol and recreational drugs can also impair your immune system, making you more vulnerable to disease.   Do not engage in risky and dangerous sexual practices, including:   Vaginal or anal sex without a condom.   Oral sex on a man without a condom.  Oral sex on a woman without a female condom.   Using saliva to lubricate a condom.   Any other sexual contact in which body fluids or blood from one partner contact the other partner.   You should use only latex condoms for men and water soluble lubricants. Petroleum based lubricants or oils used to lubricate a condom will weaken the condom and increase the chance that it will break.   Think very carefully before having sex with anyone who is high risk for STDs and HIV. This includes IV drug users, people with multiple sexual partners, or people who have had an STD, or a positive hepatitis or HIV blood test.   Remember that even if your partner has had only one previous partner, their previous partner might have had multiple partners. If so, you are at high risk of being exposed to an STD. You and your sex partner should be the only sex partners with each other, with no one else  involved.   A vaccine is available for hepatitis B and HPV through your caregiver or the Public Health Department. Everyone should be vaccinated with these vaccines.   Avoid risky sex practices. Sex acts that can break the skin make you more likely to get an STD.  SEEK MEDICAL CARE IF:   If you think you have an STD, even if you do not have any symptoms. Contact your caregiver for evaluation and treatment, if needed.   You think or know your sex partner has acquired an STD.   You have any of the symptoms mentioned above.  Document Released: 12/09/2004 Document Revised: 10/21/2011 Document Reviewed: 10/01/2009 [facility] Patient Information 2012 [place], [place].

## 2012-05-05 LAB — GC/CHLAMYDIA PROBE AMP, GENITAL
Chlamydia, DNA Probe: NEGATIVE
GC Probe Amp, Genital: NEGATIVE

## 2012-05-07 ENCOUNTER — Encounter (HOSPITAL_COMMUNITY): Payer: Self-pay | Admitting: Emergency Medicine

## 2012-05-07 ENCOUNTER — Emergency Department (HOSPITAL_COMMUNITY)
Admission: EM | Admit: 2012-05-07 | Discharge: 2012-05-07 | Disposition: A | Discharge status: Home / Self Care | Payer: Self-pay | Attending: Emergency Medicine | Admitting: Emergency Medicine

## 2012-05-07 DIAGNOSIS — N390 Urinary tract infection, site not specified: Secondary | ICD-10-CM | POA: Insufficient documentation

## 2012-05-07 DIAGNOSIS — R319 Hematuria, unspecified: Secondary | ICD-10-CM | POA: Insufficient documentation

## 2012-05-07 DIAGNOSIS — R300 Dysuria: Secondary | ICD-10-CM | POA: Insufficient documentation

## 2012-05-07 LAB — URINALYSIS, ROUTINE W REFLEX MICROSCOPIC
Bilirubin Urine: NEGATIVE
Glucose, UA: NEGATIVE mg/dL
Ketones, ur: NEGATIVE mg/dL
Nitrite: NEGATIVE
Protein, ur: NEGATIVE mg/dL
Specific Gravity, Urine: 1.025 (ref 1.005–1.030)
Urobilinogen, UA: 0.2 mg/dL (ref 0.0–1.0)
pH: 5.5 (ref 5.0–8.0)

## 2012-05-07 LAB — URINE MICROSCOPIC-ADD ON

## 2012-05-07 LAB — POCT PREGNANCY, URINE: Preg Test, Ur: NEGATIVE

## 2012-05-07 MED ORDER — PHENAZOPYRIDINE HCL 200 MG PO TABS: 200.0000 mg | ORAL_TABLET | Freq: Three times a day (TID) | ORAL | Status: AC

## 2012-05-07 MED ORDER — CEPHALEXIN 500 MG PO CAPS: 500.0000 mg | ORAL_CAPSULE | Freq: Four times a day (QID) | ORAL | Status: AC

## 2012-05-07 NOTE — Discharge Instructions (Signed)

## 2012-05-07 NOTE — ED Provider Notes (Signed)
Medical screening examination/treatment/procedure(s) were performed by non-physician practitioner and as supervising physician I was immediately available for consultation/collaboration.  Donielle Radziewicz R. Katheryn Culliton, MD 05/07/12 1414 

## 2012-05-07 NOTE — ED Provider Notes (Signed)
History     CSN: [id number]  Arrival date & time 05/07/12  4098   First MD Initiated Contact with Patient 05/07/12 [phone]      Chief Complaint  Patient presents with  . Hematuria    (Consider location/radiation/quality/duration/timing/severity/associated sxs/prior treatment) HPI  Pt presents to the ED with complaints of blood in urine last night and dysuria that started yesterday morning. She denies hx of frequent UTIs, denies suprapubic or abdominal pains, she denies N/V/D, fevers, chills, diarrhea. The patient is in NAD and VSS.  Past Medical History  Diagnosis Date  . No pertinent past medical history   . Acid reflux     Past Surgical History  Procedure Date  . No past surgeries   . Laparoscopy for ectopic pregnancy     No family history on file.  History  Substance Use Topics  . Smoking status: Former Games developer  . Smokeless tobacco: Never Used  . Alcohol Use: No    OB History    Grav Para Term Preterm Abortions TAB SAB Ect Mult Living   1    1   1   0      Review of Systems   HEENT: denies blurry vision or change in hearing PULMONARY: Denies difficulty breathing and SOB CARDIAC: denies chest pain or heart palpitations MUSCULOSKELETAL:  denies being unable to ambulate ABDOMEN AL: denies abdominal pain GU: denies loss of bowel or urinary control NEURO: denies numbness and tingling in extremities SKIN: no new rashes PSYCH: patient behavior is normal NECK: Not complaining of neck pain    Allergies  Vicodin  Home Medications   Current Outpatient Rx  Name Route Sig Dispense Refill  . IBUPROFEN 200 MG PO TABS Oral Take 400 mg by mouth every 8 (eight) hours as needed. For pain.    Marland Kitchen LANSOPRAZOLE 15 MG PO CPDR Oral Take 15 mg by mouth daily as needed. For heartburn.     . CEPHALEXIN 500 MG PO CAPS Oral Take 1 capsule (500 mg total) by mouth 4 (four) times daily. 28 capsule 0  . METRONIDAZOLE 500 MG PO TABS Oral Take 2,000 mg by mouth once.    Marland Kitchen  PHENAZOPYRIDINE HCL 200 MG PO TABS Oral Take 1 tablet (200 mg total) by mouth 3 (three) times daily. 6 tablet 0    BP 141/92  Pulse 74  Temp 98 F (36.7 C) (Oral)  Resp 18  SpO2 98%  LMP 04/07/2012  Physical Exam  Nursing note and vitals reviewed. Constitutional: She appears well-developed and well-nourished. No distress.  HENT:  Head: Normocephalic and atraumatic.  Eyes: Pupils are equal, round, and reactive to light.  Neck: Normal range of motion. Neck supple.  Cardiovascular: Normal rate and regular rhythm.   Pulmonary/Chest: Effort normal.  Abdominal: Soft.  Neurological: She is alert.  Skin: Skin is warm and dry.    ED Course  Procedures (including critical care time)  Labs Reviewed  URINALYSIS, ROUTINE W REFLEX MICROSCOPIC - Abnormal; Notable for the following:    APPearance CLOUDY (*)     Hgb urine dipstick TRACE (*)     Leukocytes, UA SMALL (*)     All other components within normal limits  URINE MICROSCOPIC-ADD ON - Abnormal; Notable for the following:    Squamous Epithelial / LPF FEW (*)     Bacteria, UA MANY (*)     All other components within normal limits  POCT PREGNANCY, URINE   No results found.   1. UTI (lower urinary tract  infection)       MDM  Urinalysis shows UTI. Pt given Keflex and Pyridium. No complicating factors.   Pt has been advised of the symptoms that warrant their return to the ED. Patient has voiced understanding and has agreed to follow-up with the PCP or specialist.         Dorthula Matas, PA 05/07/12 [phone]

## 2012-05-07 NOTE — ED Notes (Signed)
Onset of blood in urine yesterday, now w/ dysuria and hematuria

## 2012-10-21 ENCOUNTER — Encounter (HOSPITAL_COMMUNITY): Payer: Self-pay | Admitting: Obstetrics and Gynecology

## 2012-10-21 ENCOUNTER — Inpatient Hospital Stay (HOSPITAL_COMMUNITY)
Admission: AD | Admit: 2012-10-21 | Discharge: 2012-10-21 | Disposition: A | Discharge status: Home / Self Care | Payer: Self-pay | Source: Ambulatory Visit | Attending: Obstetrics & Gynecology | Admitting: Obstetrics & Gynecology

## 2012-10-21 DIAGNOSIS — N949 Unspecified condition associated with female genital organs and menstrual cycle: Secondary | ICD-10-CM | POA: Insufficient documentation

## 2012-10-21 DIAGNOSIS — N760 Acute vaginitis: Secondary | ICD-10-CM

## 2012-10-21 DIAGNOSIS — M549 Dorsalgia, unspecified: Secondary | ICD-10-CM | POA: Insufficient documentation

## 2012-10-21 LAB — URINE MICROSCOPIC-ADD ON

## 2012-10-21 LAB — URINALYSIS, ROUTINE W REFLEX MICROSCOPIC
Bilirubin Urine: NEGATIVE
Glucose, UA: NEGATIVE mg/dL
Ketones, ur: 15 mg/dL — AB
Leukocytes, UA: NEGATIVE
Nitrite: NEGATIVE
Protein, ur: NEGATIVE mg/dL
Specific Gravity, Urine: 1.025 (ref 1.005–1.030)
Urobilinogen, UA: 0.2 mg/dL (ref 0.0–1.0)
pH: 7 (ref 5.0–8.0)

## 2012-10-21 LAB — WET PREP, GENITAL
Clue Cells Wet Prep HPF POC: NONE SEEN
Trich, Wet Prep: NONE SEEN
WBC, Wet Prep HPF POC: NONE SEEN
Yeast Wet Prep HPF POC: NONE SEEN

## 2012-10-21 LAB — POCT PREGNANCY, URINE: Preg Test, Ur: NEGATIVE

## 2012-10-21 MED ORDER — TERCONAZOLE 0.8 % VA CREA: 1.0000 | TOPICAL_CREAM | Freq: Every day | VAGINAL | Status: DC

## 2012-10-21 NOTE — ED Provider Notes (Signed)
History     CSN: [id number]  Arrival date and time: 10/21/12 1343   None     Chief Complaint  Patient presents with  . Urinary Incontinence  . Back Pain   Patient is a 36 y.o. female presenting with back pain. The history is provided by the patient. No language interpreter was used.  Back Pain  This is a new problem. The current episode started more than 1 week ago. The problem occurs constantly. The problem has not changed since onset.The pain is associated with no known injury. The quality of the pain is described as aching. The pain does not radiate. The pain is at a severity of 1/10. The pain is mild. The pain is the same all the time. Associated symptoms include bladder incontinence. Pertinent negatives include no chest pain, no fever, no abdominal pain, no abdominal swelling, no bowel incontinence, no dysuria and no pelvic pain. She has tried nothing for the symptoms.      Past Medical History  Diagnosis Date  . No pertinent past medical history   . Acid reflux     Past Surgical History  Procedure Date  . No past surgeries   . Laparoscopy for ectopic pregnancy     History reviewed. No pertinent family history.  History  Substance Use Topics  . Smoking status: Former Games developer  . Smokeless tobacco: Never Used  . Alcohol Use: No    Allergies:  Allergies  Allergen Reactions  . Vicodin (Hydrocodone-Acetaminophen) Nausea And Vomiting    Prescriptions prior to admission  Medication Sig Dispense Refill  . ibuprofen (ADVIL,MOTRIN) 200 MG tablet Take 400 mg by mouth every 8 (eight) hours as needed. For pain.      Marland Kitchen lansoprazole (PREVACID) 15 MG capsule Take 15 mg by mouth daily as needed. For heartburn.         Review of Systems  Constitutional: Negative for fever and chills.  HENT: Negative.   Eyes: Negative.   Respiratory: Negative.   Cardiovascular: Negative.  Negative for chest pain.  Gastrointestinal: Negative.  Negative for abdominal pain and bowel  incontinence.  Genitourinary: Positive for bladder incontinence, urgency and frequency. Negative for dysuria, hematuria, flank pain and pelvic pain.       Stress incontinence  Musculoskeletal: Positive for back pain.  Skin: Negative.  Negative for rash.  Neurological: Negative.   Endo/Heme/Allergies: Negative.   Psychiatric/Behavioral: Negative.    Physical Exam   Blood pressure 126/65, pulse 85, temperature 98.4 F (36.9 C), temperature source Oral, resp. rate 20, height 5\' 6"  (1.676 m), weight 262 lb 9.6 oz (119.115 kg), last menstrual period 10/03/2012, SpO2 99.00%.  Physical Exam  Vitals reviewed. Constitutional: She is oriented to person, place, and time. She appears well-developed and well-nourished.  HENT:  Head: Normocephalic.  Eyes: Conjunctivae normal are normal.  Neck: Normal range of motion.  Respiratory: Effort normal.  GI: Soft. She exhibits no distension and no mass. There is no tenderness. There is no rebound and no guarding.  Genitourinary: Uterus normal. There is lesion on the right labia. Uterus is not tender. Cervix exhibits no motion tenderness, no discharge and no friability. Right adnexum displays no mass, no tenderness and no fullness. Left adnexum displays no mass, no tenderness and no fullness. There is tenderness around the vagina. No erythema or bleeding around the vagina. No vaginal discharge found.  Musculoskeletal: Normal range of motion.  Lymphadenopathy:       Right: No inguinal adenopathy present.  Left: No inguinal adenopathy present.  Neurological: She is alert and oriented to person, place, and time.  Skin: Skin is warm, dry and intact.  Psychiatric: She has a normal mood and affect. Her speech is normal. Judgment and thought content normal. Cognition and memory are normal.      Results for orders placed during the hospital encounter of 10/21/12 (from the past 24 hour(s))  URINALYSIS, ROUTINE W REFLEX MICROSCOPIC     Status: Abnormal    Collection Time   10/21/12  2:15 PM      Component Value Range   Color, Urine YELLOW  YELLOW   APPearance HAZY (*) CLEAR   Specific Gravity, Urine 1.025  1.005 - 1.030   pH 7.0  5.0 - 8.0   Glucose, UA NEGATIVE  NEGATIVE mg/dL   Hgb urine dipstick SMALL (*) NEGATIVE   Bilirubin Urine NEGATIVE  NEGATIVE   Ketones, ur 15 (*) NEGATIVE mg/dL   Protein, ur NEGATIVE  NEGATIVE mg/dL   Urobilinogen, UA 0.2  0.0 - 1.0 mg/dL   Nitrite NEGATIVE  NEGATIVE   Leukocytes, UA NEGATIVE  NEGATIVE  URINE MICROSCOPIC-ADD ON     Status: Abnormal   Collection Time   10/21/12  2:15 PM      Component Value Range   Squamous Epithelial / LPF FEW (*) RARE   RBC / HPF 0-2  <3 RBC/hpf   Bacteria, UA RARE  RARE  POCT PREGNANCY, URINE     Status: Normal   Collection Time   10/21/12  2:49 PM      Component Value Range   Preg Test, Ur NEGATIVE  NEGATIVE  WET PREP, GENITAL     Status: Normal   Collection Time   10/21/12  4:00 PM      Component Value Range   Yeast Wet Prep HPF POC NONE SEEN  NONE SEEN   Trich, Wet Prep NONE SEEN  NONE SEEN   Clue Cells Wet Prep HPF POC NONE SEEN  NONE SEEN   WBC, Wet Prep HPF POC NONE SEEN  NONE SEEN   MAU Course  Procedures Wet prep and GH/CHL preformed   MDM Discussed lab results with patient.   Assessment and Plan  A: Vaginal irritation      Negative urinalysis  P;Terazol 3 vaginal cream x3 HS    Encouraged to urinate after sexual intercourse  Alveta Heimlich. 10/21/2012, 3:40 PM

## 2012-10-21 NOTE — MAU Note (Signed)
Patient states she has had urinary incontinence for about one week with back pain. Denies any vaginal bleeding or discharge at this time.

## 2012-10-22 LAB — GC/CHLAMYDIA PROBE AMP
CT Probe RNA: NEGATIVE
GC Probe RNA: NEGATIVE

## 2013-01-29 ENCOUNTER — Inpatient Hospital Stay (HOSPITAL_COMMUNITY)
Admission: AD | Admit: 2013-01-29 | Discharge: 2013-01-29 | Disposition: A | Discharge status: Home / Self Care | Payer: Self-pay | Source: Ambulatory Visit | Attending: Obstetrics & Gynecology | Admitting: Obstetrics & Gynecology

## 2013-01-29 ENCOUNTER — Encounter (HOSPITAL_COMMUNITY): Payer: Self-pay | Admitting: *Deleted

## 2013-01-29 DIAGNOSIS — N390 Urinary tract infection, site not specified: Secondary | ICD-10-CM | POA: Insufficient documentation

## 2013-01-29 DIAGNOSIS — R319 Hematuria, unspecified: Secondary | ICD-10-CM | POA: Insufficient documentation

## 2013-01-29 LAB — URINALYSIS, ROUTINE W REFLEX MICROSCOPIC
Bilirubin Urine: NEGATIVE
Glucose, UA: NEGATIVE mg/dL
Ketones, ur: NEGATIVE mg/dL
Nitrite: NEGATIVE
Protein, ur: NEGATIVE mg/dL
Specific Gravity, Urine: 1.02 (ref 1.005–1.030)
Urobilinogen, UA: 0.2 mg/dL (ref 0.0–1.0)
pH: 6 (ref 5.0–8.0)

## 2013-01-29 LAB — URINE MICROSCOPIC-ADD ON

## 2013-01-29 MED ORDER — CIPROFLOXACIN HCL 500 MG PO TABS: 500.0000 mg | ORAL_TABLET | Freq: Two times a day (BID) | ORAL | Status: AC

## 2013-01-29 NOTE — MAU Note (Signed)
Patient states she knows she has a UTI. She has blood in her urine and it burns when she urinates. No history of kidney stones.

## 2013-01-29 NOTE — MAU Provider Note (Signed)
Chief Complaint: Urinary Tract Infection   None    SUBJECTIVE HPI: Veronica Contreras is a 37 y.o. G1P0010 who presents to maternity admissions reporting suprapubic pressure and dysuria x2 days.  She reports having these symptoms 1 year ago with a UTI and is fairly sure this is what she has now.  She denies vaginal bleeding, vaginal itching/burning, h/a, dizziness, n/v, or fever/chills.     Past Medical History  Diagnosis Date  . No pertinent past medical history   . Acid reflux    Past Surgical History  Procedure Laterality Date  . No past surgeries    . Laparoscopy for ectopic pregnancy     History   Social History  . Marital Status: Single    Spouse Name: N/A    Number of Children: N/A  . Years of Education: N/A   Occupational History  . Not on file.   Social History Main Topics  . Smoking status: Former Games developer  . Smokeless tobacco: Never Used  . Alcohol Use: No  . Drug Use: 1.00 per week    Special: Marijuana  . Sexually Active: Yes    Birth Control/ Protection: None   Other Topics Concern  . Not on file   Social History Narrative  . No narrative on file   No current facility-administered medications on file prior to encounter.   Current Outpatient Prescriptions on File Prior to Encounter  Medication Sig Dispense Refill  . ibuprofen (ADVIL,MOTRIN) 200 MG tablet Take 400 mg by mouth every 8 (eight) hours as needed. For pain.      Marland Kitchen lansoprazole (PREVACID) 15 MG capsule Take 15 mg by mouth daily as needed. For heartburn.       . terconazole (TERAZOL 3) 0.8 % vaginal cream Place 1 applicator vaginally at bedtime.  20 g  0   Allergies  Allergen Reactions  . Vicodin (Hydrocodone-Acetaminophen) Nausea And Vomiting    ROS: Pertinent items in HPI  OBJECTIVE Blood pressure 123/70, pulse 80, temperature 98.7 F (37.1 C), temperature source Oral, resp. rate 18, height 5\' 5"  (1.651 m), weight 121.564 kg (268 lb), last menstrual period 01/03/2013. GENERAL:  Well-developed, well-nourished female in no acute distress.  HEENT: Normocephalic HEART: normal rate RESP: normal effort ABDOMEN: Soft, non-tender BACK: Negative CVA tenderness EXTREMITIES: Nontender, no edema NEURO: Alert and oriented SPECULUM EXAM: Deferred  LAB RESULTS Results for orders placed during the hospital encounter of 01/29/13 (from the past 48 hour(s))  URINALYSIS, ROUTINE W REFLEX MICROSCOPIC     Status: Abnormal   Collection Time    01/29/13  8:30 PM      Result Value Range   Color, Urine YELLOW  YELLOW   APPearance CLEAR  CLEAR   Specific Gravity, Urine 1.020  1.005 - 1.030   pH 6.0  5.0 - 8.0   Glucose, UA NEGATIVE  NEGATIVE mg/dL   Hgb urine dipstick LARGE (*) NEGATIVE   Bilirubin Urine NEGATIVE  NEGATIVE   Ketones, ur NEGATIVE  NEGATIVE mg/dL   Protein, ur NEGATIVE  NEGATIVE mg/dL   Urobilinogen, UA 0.2  0.0 - 1.0 mg/dL   Nitrite NEGATIVE  NEGATIVE   Leukocytes, UA TRACE (*) NEGATIVE  URINE MICROSCOPIC-ADD ON     Status: None   Collection Time    01/29/13  8:30 PM      Result Value Range   Squamous Epithelial / LPF RARE  RARE   WBC, UA 0-2  <3 WBC/hpf   RBC / HPF 11-20  <3 RBC/hpf  ASSESSMENT 1. UTI (lower urinary tract infection)   2. Hematuria, unspecified     PLAN Discharge home Treat for symptomatic UTI with urine culture pending.  Cipro 500 mg BID x7 days Return to MAU as needed    Medication List    ASK your doctor about these medications       ibuprofen 200 MG tablet  Commonly known as:  ADVIL,MOTRIN  Take 400 mg by mouth every 8 (eight) hours as needed. For pain.     lansoprazole 15 MG capsule  Commonly known as:  PREVACID  Take 15 mg by mouth daily as needed. For heartburn.     terconazole 0.8 % vaginal cream  Commonly known as:  TERAZOL 3  Place 1 applicator vaginally at bedtime.         Sharen Counter Certified Nurse-Midwife 01/29/2013  8:57 PM

## 2013-02-01 NOTE — MAU Provider Note (Signed)
Attestation of Attending Supervision of Advanced Practitioner (CNM/NP): Evaluation and management procedures were performed by the Advanced Practitioner under my supervision and collaboration. I have reviewed the Advanced Practitioner's note and chart, and I agree with the management and plan.  Tehya Leath H. 3:51 PM

## 2013-04-19 ENCOUNTER — Other Ambulatory Visit: Payer: Self-pay | Admitting: Internal Medicine

## 2013-04-19 DIAGNOSIS — E049 Nontoxic goiter, unspecified: Secondary | ICD-10-CM

## 2013-05-03 ENCOUNTER — Ambulatory Visit
Admission: RE | Admit: 2013-05-03 | Discharge: 2013-05-03 | Disposition: A | Discharge status: Home / Self Care | Payer: BC Managed Care – PPO | Source: Ambulatory Visit | Attending: Internal Medicine | Admitting: Internal Medicine

## 2013-05-03 DIAGNOSIS — E049 Nontoxic goiter, unspecified: Secondary | ICD-10-CM

## 2013-05-28 ENCOUNTER — Ambulatory Visit (INDEPENDENT_AMBULATORY_CARE_PROVIDER_SITE_OTHER): Payer: BC Managed Care – PPO | Admitting: Surgery

## 2013-05-28 ENCOUNTER — Encounter (INDEPENDENT_AMBULATORY_CARE_PROVIDER_SITE_OTHER): Payer: Self-pay | Admitting: Surgery

## 2013-05-28 VITALS — BP 132/82 | HR 76 | Resp 14 | Ht 65.0 in | Wt 243.8 lb

## 2013-05-28 DIAGNOSIS — E041 Nontoxic single thyroid nodule: Secondary | ICD-10-CM

## 2013-05-28 NOTE — Progress Notes (Signed)
Patient ID: Veronica Contreras, female   DOB: [birth year], 37 y.o.   MRN: [id number]  Chief Complaint  Patient presents with  . New Evaluation    eval goiter    HPI Veronica Contreras is a 36 y.o. female.  Patient sent a request of Dr. Barbaraann Barthel do to goiter. She is developed fullness in her neck and a choking sensation over the last year. Ultrasound was done which showed enlarged gland and a dominant right thyroid lobe nodule measuring 3 cm and a 1.2 cm left thyroid lobe mass. Advise any change in her weight, palpitations or appetite. Does have mild discomfort with swallowing especially popcorn. HPI  Past Medical History  Diagnosis Date  . No pertinent past medical history   . Acid reflux     Past Surgical History  Procedure Laterality Date  . No past surgeries    . Laparoscopy for ectopic pregnancy      Family History  Problem Relation Age of Onset  . Cancer Maternal Aunt     breast    Social History History  Substance Use Topics  . Smoking status: Former Smoker    Quit date: 05/28/2012  . Smokeless tobacco: Never Used  . Alcohol Use: No    Allergies  Allergen Reactions  . Vicodin (Hydrocodone-Acetaminophen) Nausea And Vomiting    Current Outpatient Prescriptions  Medication Sig Dispense Refill  . ibuprofen (ADVIL,MOTRIN) 200 MG tablet Take 400 mg by mouth every 8 (eight) hours as needed. For pain.      Marland Kitchen lansoprazole (PREVACID) 15 MG capsule Take 15 mg by mouth daily as needed. For heartburn.        No current facility-administered medications for this visit.    Review of Systems Review of Systems  Constitutional: Negative for fever, chills and unexpected weight change.  HENT: Negative for hearing loss, congestion, sore throat, trouble swallowing and voice change.   Eyes: Negative for visual disturbance.  Respiratory: Negative for cough and wheezing.   Cardiovascular: Negative for chest pain, palpitations and leg swelling.  Gastrointestinal: Negative for nausea,  vomiting, abdominal pain, diarrhea, constipation, blood in stool, abdominal distention and anal bleeding.  Genitourinary: Negative for hematuria, vaginal bleeding and difficulty urinating.  Musculoskeletal: Negative for arthralgias.  Skin: Negative for rash and wound.  Neurological: Negative for seizures, syncope and headaches.  Hematological: Negative for adenopathy. Does not bruise/bleed easily.  Psychiatric/Behavioral: Negative for confusion.    Blood pressure 132/82, pulse 76, resp. rate 14, height 5\' 5"  (1.651 m), weight 243 lb 12.8 oz (110.587 kg).  Physical Exam Physical Exam  Constitutional: She is oriented to person, place, and time. She appears well-developed and well-nourished.  HENT:  Head: Normocephalic and atraumatic.  Eyes: EOM are normal. Pupils are equal, round, and reactive to light.  Neck: Trachea normal, normal range of motion and phonation normal. Thyromegaly present. No mass present.  Pulmonary/Chest: No stridor.  Musculoskeletal: Normal range of motion.  Lymphadenopathy:    She has no cervical adenopathy.  Neurological: She is alert and oriented to person, place, and time.  Skin: Skin is warm and dry.  Psychiatric: She has a normal mood and affect. Her behavior is normal. Judgment and thought content normal.    Data Reviewed Clinical Data: Thyroid goiter and dysphagia.  THYROID ULTRASOUND  Technique: Ultrasound examination of the thyroid gland and adjacent  soft tissues was performed.  Comparison: None.  Findings:  Right thyroid lobe: 7.4 x 2.3 x 3.1 cm  Left thyroid lobe: 6.7 x 2.1  x 2.7 cm  Isthmus: 1.5 cm  Focal nodules: There is a dominant nodule in the inferior aspect  of the right globe which appears solid and measures approximately  3.4 x 2.1 x 2.9 cm. This nodule contains no internal  calcifications and is not associated with increased vascularity.  Small solid and partially cystic nodule in the inferior left lobe  measures 1.2 x 1.0 x 1.2 cm.   Lymphadenopathy: None visualized.  IMPRESSION:  Moderate thyroid enlargement. Dominant right thyroid nodule is  identified measuring just over 3 cm in greatest diameter. A  smaller 1.2 cm nodule is present in the inferior left lobe.  Sonographic follow-up is recommended. Biopsy of the dominant  nodule could also be considered given size.  Findings meet consensus criteria for biopsy. Ultrasound-guided  fine needle aspiration should be considered of the dominant nodule,  as per the consensus statement: Management of Thyroid Nodules  Detected at [place]: Society of Radiologists in Ultrasound Consensus  Conference Statement. Radiology 2005; [id number].   Assessment    Goiter Right thyroid nodule 3 cm Left thyroid nodule 1.3 cm    Plan    Recommend ultrasound-guided fine needle aspiration to determine if dominant right thyroid nodules malignant. If this is benign, options include observation with a trial of Synthroid to suppress TSH levels versus thyroidectomy due to her globus symptoms. Return to clinic after biopsy to review results and discuss options.       Kinnick Maus A. 05/28/2013, 3:23 PM

## 2013-05-28 NOTE — Patient Instructions (Signed)
Thyroid Diseases  Your thyroid is a butterfly-shaped gland in your neck. It is located just above your collarbone. It is one of your endocrine glands, which make hormones. The thyroid helps set your metabolism. Metabolism is how your body gets energy from the foods you eat.   Millions of people have thyroid diseases. Women experience thyroid problems more often than men. In fact, overactive thyroid problems (hyperthyroidism) occur in 1% of all women. If you have a thyroid disease, your body may use energy more slowly or quickly than it should.   Thyroid problems also include an immune disease where your body reacts against your thyroid gland (called thyroiditis). A different problem involves lumps and bumps (called nodules) that develop in the gland. The nodules are usually, but not always, noncancerous.  THE MOST COMMON THYROID PROBLEMS AND CAUSES ARE DISCUSSED BELOW  There are many causes for thyroid problems. Treatment depends upon the exact diagnosis and includes trying to reset your body's metabolism to a normal rate.  Hyperthyroidism  Too much thyroid hormone from an overactive thyroid gland is called hyperthyroidism. In hyperthyroidism, the body's metabolism speeds up. One of the most frequent forms of hyperthyroidism is known as Graves' disease. Graves' disease tends to run in families. Although Graves' is thought to be caused by a problem with the immune system, the exact nature of the genetic problem is unknown.  Hypothyroidism  Too little thyroid hormone from an underactive thyroid gland is called hypothyroidism. In hypothyroidism, the body's metabolism is slowed. Several things can cause this condition. Most causes affect the thyroid gland directly and hurt its ability to make enough hormone.   Rarely, there may be a pituitary gland tumor (located near the base of the brain). The tumor can block the pituitary from producing thyroid-stimulating hormone (TSH). Your body makes TSH to stimulate the thyroid  to work properly. If the pituitary does not make enough TSH, the thyroid fails to make enough hormones needed for good health.  Whether the problem is caused by thyroid conditions or by the pituitary gland, the result is that the thyroid is not making enough hormones. Hypothyroidism causes many physical and mental processes to become sluggish. The body consumes less oxygen and produces less body heat.  Thyroid Nodules  A thyroid nodule is a small swelling or lump in the thyroid gland. They are common. These nodules represent either a growth of thyroid tissue or a fluid-filled cyst. Both form a lump in the thyroid gland. Almost half of all people will have tiny thyroid nodules at some point in their lives. Typically, these are not noticeable until they become large and affect normal thyroid size. Larger nodules that are greater than a half inch across (about 1 centimeter) occur in about 5 percent of people.  Although most nodules are not cancerous, people who have them should seek medical care to rule out cancer. Also, some thyroid nodules may produce too much thyroid hormone or become too large. Large nodules or a large gland can interfere with breathing or swallowing or may cause neck discomfort.  Other problems  Other thyroid problems include cancer and thyroiditis. Thyroiditis is a malfunction of the body's immune system. Normally, the immune system works to defend the body against infection and other problems. When the immune system is not working properly, it may mistakenly attack normal cells, tissues, and organs. Examples of autoimmune diseases are Hashimoto's thyroiditis (which causes low thyroid function) and Graves' disease (which causes excess thyroid function).  SYMPTOMS   Symptoms   vary greatly depending upon the exact type of problem with the thyroid.  Hyperthyroidism-is when your thyroid is too active and makes more thyroid hormone than your body needs. The most common cause is Graves' Disease. Too  much thyroid hormone can cause some or all of the following symptoms:  · Anxiety.  · Irritability.  · Difficulty sleeping.  · Fatigue.  · A rapid or irregular heartbeat.  · A fine tremor of your hands or fingers.  · An increase in perspiration.  · Sensitivity to heat.  · Weight loss, despite normal food intake.  · Brittle hair.  · Enlargement of your thyroid gland (goiter).  · Light menstrual periods.  · Frequent bowel movements.  Graves' disease can specifically cause eye and skin problems. The skin problems involve reddening and swelling of the skin, often on your shins and on the top of your feet. Eye problems can include the following:  · Excess tearing and sensation of grit or sand in either or both eyes.  · Reddened or inflamed eyes.  · Widening of the space between your eyelids.  · Swelling of the lids and tissues around the eyes.  · Light sensitivity.  · Ulcers on the cornea.  · Double vision.  · Limited eye movements.  · Blurred or reduced vision.  Hypothyroidism- is when your thyroid gland is not active enough. This is more common than hyperthyroidism. Symptoms can vary a lot depending of the severity of the hormone deficiency. Symptoms may develop over a long period of time and can include several of the following:  · Fatigue.  · Sluggishness.  · Increased sensitivity to cold.  · Constipation.  · Pale, dry skin.  · A puffy face.  · Hoarse voice.  · High blood cholesterol level.  · Unexplained weight gain.  · Muscle aches, tenderness and stiffness.  · Pain, stiffness or swelling in your joints.  · Muscle weakness.  · Heavier than normal menstrual periods.  · Brittle fingernails and hair.  · Depression.  Thyroid Nodules - most do not cause signs or symptoms. Occasionally, some may become so large that you can feel or even see the swelling at the base of your neck. You may realize a lump or swelling is there when you are shaving or putting on makeup. Men might become aware of a nodule when shirt collars  suddenly feel too tight.  Some nodules produce too much thyroid hormone. This can produce the same symptoms as hyperthyroidism (see above).  Thyroid nodules are seldom cancerous. However, a nodule is more likely to be malignant (cancerous) if it:  · Grows quickly or feels hard.  · Causes you to become hoarse or to have trouble swallowing or breathing.  · Causes enlarged lymph nodes under your jaw or in your neck.  DIAGNOSIS   Because there are so many possible thyroid conditions, your caregiver may ask for a number of tests. They will do this in order to narrow down the exact diagnosis. These tests can include:  · Blood and antibody tests.  · Special thyroid scans using small, safe amounts of radioactive iodine.  · Ultrasound of the thyroid gland (particularly if there is a nodule or lump).  · Biopsy. This is usually done with a special needle. A needle biopsy is a procedure to obtain a sample of cells from the thyroid. The tissue will be tested in a lab and examined under a microscope.  TREATMENT   Treatment depends on the exact diagnosis.    Hyperthyroidism  · Beta-blockers help relieve many of the symptoms.  · Anti-thyroid medications prevent the thyroid from making excess hormones.  · Radioactive iodine treatment can destroy overactive thyroid cells. The iodine can permanently decrease the amount of hormone produced.  · Surgery to remove the thyroid gland.  · Treatments for eye problems that come from Graves' disease also include medications and special eye surgery, if felt to be appropriate.  Hypothyroidism  Thyroid replacement with levothyroxine is the mainstay of treatment. Treatment with thyroid replacement is usually lifelong and will require monitoring and adjustment from time to time.  Thyroid Nodules  · Watchful waiting. If a small nodule causes no symptoms or signs of cancer on biopsy, then no treatment may be chosen at first. Re-exam and re-checking blood tests would be the recommended  follow-up.  · Anti-thyroid medications or radioactive iodine treatment may be recommended if the nodules produce too much thyroid hormone (see Treatment for Hyperthyroidism above).  · Alcohol ablation. Injections of small amounts of ethyl alcohol (ethanol) can cause a non-cancerous nodule to shrink in size.  · Surgery (see Treatment for Hyperthyroidism above).  HOME CARE INSTRUCTIONS   · Take medications as instructed.  · Follow through on recommended testing.  SEEK MEDICAL CARE IF:   · You feel that you are developing symptoms of Hyperthyroidism or Hypothyroidism as described above.  · You develop a new lump/nodule in the neck/thyroid area that you had not noticed before.  · You feel that you are having side effects from medicines prescribed.  · You develop trouble breathing or swallowing.  SEEK IMMEDIATE MEDICAL CARE IF:   · You develop a fever of 102° F (38.9° C) or higher.  · You develop severe sweating.  · You develop palpitations and/or rapid heart beat.  · You develop shortness of breath.  · You develop nausea and vomiting.  · You develop extreme shakiness.  · You develop agitation.  · You develop lightheadedness or have a fainting episode.  Document Released: 08/29/2007 Document Revised: 01/24/2012 Document Reviewed: 08/29/2007  [employer]® Patient Information ©2014 [employer].

## 2013-05-30 ENCOUNTER — Other Ambulatory Visit (HOSPITAL_COMMUNITY)
Admission: RE | Admit: 2013-05-30 | Discharge: 2013-05-30 | Disposition: A | Discharge status: Home / Self Care | Payer: BC Managed Care – PPO | Source: Ambulatory Visit | Attending: Interventional Radiology | Admitting: Interventional Radiology

## 2013-05-30 ENCOUNTER — Ambulatory Visit
Admission: RE | Admit: 2013-05-30 | Discharge: 2013-05-30 | Disposition: A | Discharge status: Home / Self Care | Payer: BC Managed Care – PPO | Source: Ambulatory Visit | Attending: Surgery | Admitting: Surgery

## 2013-05-30 DIAGNOSIS — E041 Nontoxic single thyroid nodule: Secondary | ICD-10-CM

## 2013-06-01 ENCOUNTER — Telehealth (INDEPENDENT_AMBULATORY_CARE_PROVIDER_SITE_OTHER): Payer: Self-pay

## 2013-06-01 NOTE — Telephone Encounter (Signed)
Message copied by Brennan Bailey on Fri Jun 01, 2013  8:48 AM ------      Message from: Harriette Bouillon A      Created: Thu May 31, 2013  2:47 PM       No cancer.  Follow up in 6 months ------

## 2013-06-01 NOTE — Telephone Encounter (Signed)
Called patient and gave her benign results. Told her we will see her in 6 months but to call if she has any problems before then.

## 2013-06-12 ENCOUNTER — Encounter (INDEPENDENT_AMBULATORY_CARE_PROVIDER_SITE_OTHER): Payer: Self-pay | Admitting: Surgery

## 2013-06-21 ENCOUNTER — Encounter (INDEPENDENT_AMBULATORY_CARE_PROVIDER_SITE_OTHER): Payer: BC Managed Care – PPO | Admitting: Surgery

## 2013-07-23 ENCOUNTER — Other Ambulatory Visit: Payer: Self-pay

## 2013-07-23 DIAGNOSIS — Z1231 Encounter for screening mammogram for malignant neoplasm of breast: Secondary | ICD-10-CM

## 2013-07-25 ENCOUNTER — Other Ambulatory Visit: Payer: Self-pay | Admitting: Internal Medicine

## 2013-07-25 DIAGNOSIS — K2100 Gastro-esophageal reflux disease with esophagitis, without bleeding: Secondary | ICD-10-CM

## 2013-08-01 ENCOUNTER — Ambulatory Visit
Admission: RE | Admit: 2013-08-01 | Discharge: 2013-08-01 | Disposition: A | Discharge status: Home / Self Care | Payer: BC Managed Care – PPO | Source: Ambulatory Visit | Attending: Internal Medicine | Admitting: Internal Medicine

## 2013-08-01 DIAGNOSIS — K2100 Gastro-esophageal reflux disease with esophagitis, without bleeding: Secondary | ICD-10-CM

## 2013-08-13 ENCOUNTER — Ambulatory Visit
Admission: RE | Admit: 2013-08-13 | Discharge: 2013-08-13 | Disposition: A | Discharge status: Home / Self Care | Payer: BC Managed Care – PPO | Source: Ambulatory Visit

## 2013-08-13 DIAGNOSIS — Z1231 Encounter for screening mammogram for malignant neoplasm of breast: Secondary | ICD-10-CM

## 2013-08-16 ENCOUNTER — Other Ambulatory Visit: Payer: Self-pay | Admitting: Internal Medicine

## 2013-08-16 DIAGNOSIS — R928 Other abnormal and inconclusive findings on diagnostic imaging of breast: Secondary | ICD-10-CM

## 2013-08-28 ENCOUNTER — Ambulatory Visit
Admission: RE | Admit: 2013-08-28 | Discharge: 2013-08-28 | Disposition: A | Discharge status: Home / Self Care | Payer: BC Managed Care – PPO | Source: Ambulatory Visit | Attending: Internal Medicine | Admitting: Internal Medicine

## 2013-08-28 ENCOUNTER — Other Ambulatory Visit: Payer: Self-pay | Admitting: Internal Medicine

## 2013-08-28 DIAGNOSIS — R928 Other abnormal and inconclusive findings on diagnostic imaging of breast: Secondary | ICD-10-CM

## 2013-08-29 ENCOUNTER — Ambulatory Visit
Admission: RE | Admit: 2013-08-29 | Discharge: 2013-08-29 | Disposition: A | Discharge status: Home / Self Care | Payer: BC Managed Care – PPO | Source: Ambulatory Visit | Attending: Internal Medicine | Admitting: Internal Medicine

## 2013-08-29 DIAGNOSIS — R928 Other abnormal and inconclusive findings on diagnostic imaging of breast: Secondary | ICD-10-CM

## 2013-09-20 ENCOUNTER — Other Ambulatory Visit: Payer: Self-pay

## 2013-10-09 ENCOUNTER — Encounter (HOSPITAL_COMMUNITY): Payer: Self-pay | Admitting: Emergency Medicine

## 2013-10-09 ENCOUNTER — Other Ambulatory Visit (HOSPITAL_COMMUNITY)
Admission: RE | Admit: 2013-10-09 | Discharge: 2013-10-09 | Disposition: A | Discharge status: Home / Self Care | Payer: BC Managed Care – PPO | Source: Ambulatory Visit | Attending: Emergency Medicine | Admitting: Emergency Medicine

## 2013-10-09 ENCOUNTER — Emergency Department (HOSPITAL_COMMUNITY)
Admission: EM | Admit: 2013-10-09 | Discharge: 2013-10-09 | Disposition: A | Discharge status: Home / Self Care | Payer: BC Managed Care – PPO | Source: Home / Self Care

## 2013-10-09 DIAGNOSIS — B373 Candidiasis of vulva and vagina: Secondary | ICD-10-CM

## 2013-10-09 DIAGNOSIS — Z113 Encounter for screening for infections with a predominantly sexual mode of transmission: Secondary | ICD-10-CM | POA: Insufficient documentation

## 2013-10-09 DIAGNOSIS — N760 Acute vaginitis: Secondary | ICD-10-CM | POA: Insufficient documentation

## 2013-10-09 DIAGNOSIS — B3731 Acute candidiasis of vulva and vagina: Secondary | ICD-10-CM

## 2013-10-09 LAB — POCT URINALYSIS DIP (DEVICE)
Bilirubin Urine: NEGATIVE
Glucose, UA: NEGATIVE mg/dL
Ketones, ur: NEGATIVE mg/dL
Leukocytes, UA: NEGATIVE
Nitrite: NEGATIVE
Protein, ur: NEGATIVE mg/dL
Specific Gravity, Urine: >=1.03 (ref 1.005–1.030)
Urobilinogen, UA: 0.2 mg/dL (ref 0.0–1.0)
pH: 6 (ref 5.0–8.0)

## 2013-10-09 LAB — POCT PREGNANCY, URINE: Preg Test, Ur: NEGATIVE

## 2013-10-09 MED ORDER — FLUCONAZOLE 150 MG PO TABS: ORAL_TABLET | ORAL | Status: DC

## 2013-10-09 NOTE — ED Provider Notes (Signed)
Medical screening examination/treatment/procedure(s) were performed by non-physician practitioner and as supervising physician I was immediately available for consultation/collaboration.  Leslee Home, M.D.  Reuben Likes, MD 10/09/13 [phone]

## 2013-10-09 NOTE — ED Notes (Signed)
C/o vaginal irritation x 1wk. Denies any abnormal discharge, pelvic or abdominal pain.  Pt has tried monistat since Saturday with no relief.  Pt states that symptoms seem worse today.

## 2013-10-09 NOTE — ED Provider Notes (Signed)
CSN: [id number]     Arrival date & time 10/09/13  1308 History   First MD Initiated Contact with Patient 10/09/13 1006     Chief Complaint  Patient presents with  . Vaginal Itching    x 1 wk   (Consider location/radiation/quality/duration/timing/severity/associated sxs/prior Treatment) HPI Comments: 37 year old female with complaint of vulvovaginal itching for 4 days. She has used Monistat cream during this time and states that she continues to have symptoms .   Past Medical History  Diagnosis Date  . No pertinent past medical history   . Acid reflux    Past Surgical History  Procedure Laterality Date  . No past surgeries    . Laparoscopy for ectopic pregnancy     Family History  Problem Relation Age of Onset  . Cancer Maternal Aunt     breast   History  Substance Use Topics  . Smoking status: Former Smoker    Quit date: 05/28/2012  . Smokeless tobacco: Never Used  . Alcohol Use: No   OB History   Grav Para Term Preterm Abortions TAB SAB Ect Mult Living   1    1   1   0     Review of Systems  Genitourinary: Negative for dysuria, frequency, vaginal bleeding, vaginal discharge and vaginal pain.  All other systems reviewed and are negative.    Allergies  Vicodin  Home Medications   Current Outpatient Rx  Name  Route  Sig  Dispense  Refill  . fluconazole (DIFLUCAN) 150 MG tablet      1 tab po x 1. May repeat in 72 hours if no improvement   2 tablet   0   . ibuprofen (ADVIL,MOTRIN) 200 MG tablet   Oral   Take 400 mg by mouth every 8 (eight) hours as needed. For pain.         Marland Kitchen lansoprazole (PREVACID) 15 MG capsule   Oral   Take 15 mg by mouth daily as needed. For heartburn.           BP 130/67  Pulse 65  Temp(Src) 98.5 F (36.9 C) (Oral)  Resp 16  SpO2 96%  LMP 09/13/2013 Physical Exam  Nursing note and vitals reviewed. Constitutional: She is oriented to person, place, and time. She appears well-developed and well-nourished. No distress.   Pulmonary/Chest: Effort normal. No respiratory distress.  Abdominal: Soft. There is no tenderness.  Genitourinary:  Normal external female genitalia. There is a copious amount of Monistat cream on the external genitalia and in the vagina. As much as possible was cleaned out and swab were obtained. No signs of other or abnormal vaginal discharge. No erythema. Cervix is midline. No erythema. Positive for a nabothian cyst. No discharge from the os. No erythema or tenderness to the ectocervix.  Neurological: She is alert and oriented to person, place, and time.  Skin: Skin is warm and dry.  Psychiatric: She has a normal mood and affect.    ED Course  Procedures (including critical care time) Labs Review Labs Reviewed  POCT URINALYSIS DIP (DEVICE) - Abnormal; Notable for the following:    Hgb urine dipstick SMALL (*)    All other components within normal limits  POCT PREGNANCY, URINE  CERVICOVAGINAL ANCILLARY ONLY   Imaging Review No results found.  Results for orders placed during the hospital encounter of 10/09/13  POCT URINALYSIS DIP (DEVICE)      Result Value Range   Glucose, UA NEGATIVE  NEGATIVE mg/dL   Bilirubin Urine NEGATIVE  NEGATIVE   Ketones, ur NEGATIVE  NEGATIVE mg/dL   Specific Gravity, Urine >=1.030  1.005 - 1.030   Hgb urine dipstick SMALL (*) NEGATIVE   pH 6.0  5.0 - 8.0   Protein, ur NEGATIVE  NEGATIVE mg/dL   Urobilinogen, UA 0.2  0.0 - 1.0 mg/dL   Nitrite NEGATIVE  NEGATIVE   Leukocytes, UA NEGATIVE  NEGATIVE  POCT PREGNANCY, URINE      Result Value Range   Preg Test, Ur NEGATIVE  NEGATIVE     MDM   1. Candida vaginitis      Diflucan 150 now and in 2 d.  Cont using the cream    Hayden Rasmussen, NP 10/09/13 1022

## 2013-10-15 NOTE — ED Notes (Signed)
Lab review

## 2013-11-15 HISTORY — PX: COLONOSCOPY: SHX174

## 2013-11-19 ENCOUNTER — Ambulatory Visit (INDEPENDENT_AMBULATORY_CARE_PROVIDER_SITE_OTHER): Payer: BC Managed Care – PPO | Admitting: Surgery

## 2013-11-19 ENCOUNTER — Telehealth (INDEPENDENT_AMBULATORY_CARE_PROVIDER_SITE_OTHER): Payer: Self-pay | Admitting: *Deleted

## 2013-11-19 ENCOUNTER — Encounter (INDEPENDENT_AMBULATORY_CARE_PROVIDER_SITE_OTHER): Payer: Self-pay | Admitting: Surgery

## 2013-11-19 VITALS — BP 128/76 | HR 58 | Temp 98.6°F | Resp 18 | Ht 64.0 in | Wt 233.0 lb

## 2013-11-19 DIAGNOSIS — E049 Nontoxic goiter, unspecified: Secondary | ICD-10-CM

## 2013-11-19 NOTE — Progress Notes (Signed)
Patient ID: Veronica Contreras, female   DOB: Jul 05, 1976, 38 y.o.   MRN: [id number]  Chief Complaint  Patient presents with  . Breast Cancer Long Term Follow Up    HPI Veronica Contreras is a 38 y.o. female.  Patient sent a request of Dr. Barbaraann Barthel do to goiter. She is developed fullness in her neck and a choking sensation over the last year. Ultrasound was done which showed enlarged gland and a dominant right thyroid lobe nodule measuring 3 cm and a 1.2 cm left thyroid lobe mass. Advise any change in her weight, palpitations or appetite. Does have mild discomfort with swallowing especially popcorn. She had FNA done in July 2014 which was benign.  Here for follow up 6 months.  HPI  Past Medical History  Diagnosis Date  . No pertinent past medical history   . Acid reflux     Past Surgical History  Procedure Laterality Date  . No past surgeries    . Laparoscopy for ectopic pregnancy      Family History  Problem Relation Age of Onset  . Cancer Maternal Aunt     breast    Social History History  Substance Use Topics  . Smoking status: Former Smoker    Quit date: 05/28/2012  . Smokeless tobacco: Never Used  . Alcohol Use: No    Allergies  Allergen Reactions  . Vicodin [Hydrocodone-Acetaminophen] Nausea And Vomiting    Current Outpatient Prescriptions  Medication Sig Dispense Refill  . fluconazole (DIFLUCAN) 150 MG tablet 1 tab po x 1. May repeat in 72 hours if no improvement  2 tablet  0  . ibuprofen (ADVIL,MOTRIN) 200 MG tablet Take 400 mg by mouth every 8 (eight) hours as needed. For pain.      Marland Kitchen lansoprazole (PREVACID) 15 MG capsule Take 15 mg by mouth daily as needed. For heartburn.        No current facility-administered medications for this visit.    Review of Systems Review of Systems  Constitutional: Negative for fever, chills and unexpected weight change.  HENT: Negative for hearing loss, congestion, sore throat, trouble swallowing and voice change.   Eyes:  Negative for visual disturbance.  Respiratory: Negative for cough and wheezing.   Cardiovascular: Negative for chest pain, palpitations and leg swelling.  Gastrointestinal: Negative for nausea, vomiting, abdominal pain, diarrhea, constipation, blood in stool, abdominal distention and anal bleeding.  Genitourinary: Negative for hematuria, vaginal bleeding and difficulty urinating.  Musculoskeletal: Negative for arthralgias.  Skin: Negative for rash and wound.  Neurological: Negative for seizures, syncope and headaches.  Hematological: Negative for adenopathy. Does not bruise/bleed easily.  Psychiatric/Behavioral: Negative for confusion.    Blood pressure 128/76, pulse 58, temperature 98.6 F (37 C), resp. rate 18, height 5\' 4"  (1.626 m), weight 233 lb (105.688 kg).  Physical Exam Physical Exam  Constitutional: She is oriented to person, place, and time. She appears well-developed and well-nourished.  HENT:  Head: Normocephalic and atraumatic.  Eyes: EOM are normal. Pupils are equal, round, and reactive to light.  Neck: Trachea normal, normal range of motion and phonation normal. Thyromegaly present. No mass present.  Pulmonary/Chest: No stridor.  Musculoskeletal: Normal range of motion.  Lymphadenopathy:    She has no cervical adenopathy.  Neurological: She is alert and oriented to person, place, and time.  Skin: Skin is warm and dry.  Psychiatric: She has a normal mood and affect. Her behavior is normal. Judgment and thought content normal.    Data Reviewed Clinical  Data: Thyroid goiter and dysphagia.  THYROID ULTRASOUND  Technique: Ultrasound examination of the thyroid gland and adjacent  soft tissues was performed.  Comparison: None.  Findings:  Right thyroid lobe: 7.4 x 2.3 x 3.1 cm  Left thyroid lobe: 6.7 x 2.1 x 2.7 cm  Isthmus: 1.5 cm  Focal nodules: There is a dominant nodule in the inferior aspect  of the right globe which appears solid and measures approximately   3.4 x 2.1 x 2.9 cm. This nodule contains no internal  calcifications and is not associated with increased vascularity.  Small solid and partially cystic nodule in the inferior left lobe  measures 1.2 x 1.0 x 1.2 cm.  Lymphadenopathy: None visualized.  IMPRESSION:  Moderate thyroid enlargement. Dominant right thyroid nodule is  identified measuring just over 3 cm in greatest diameter. A  smaller 1.2 cm nodule is present in the inferior left lobe.  Sonographic follow-up is recommended. Biopsy of the dominant  nodule could also be considered given size.  Findings meet consensus criteria for biopsy. Ultrasound-guided  fine needle aspiration should be considered of the dominant nodule,  as per the consensus statement: Management of Thyroid Nodules  Detected at [place]: Society of Radiologists in Ultrasound Consensus  Conference Statement. Radiology 2005; [id number].   Assessment    Goiter Right thyroid nodule 3 cm Left thyroid nodule 1.3 cm    Plan    Exam stable.  Check U/S of thyroid and return in 6 months if stable.       Veronica Contreras A. 11/19/2013, 12:06 PM

## 2013-11-19 NOTE — Telephone Encounter (Signed)
I spoke with pt and informed her of the appt for her thyroid US at [id number] on 11/23/13 with an arrival time of 1:00pm.  She states she may have to call and reschedule. I provided her with their phone number and she will call if needed.

## 2013-11-19 NOTE — Patient Instructions (Signed)
Return in 6 months.  Will set up u/s this month.

## 2013-11-23 ENCOUNTER — Other Ambulatory Visit: Payer: BC Managed Care – PPO

## 2013-11-27 ENCOUNTER — Ambulatory Visit
Admission: RE | Admit: 2013-11-27 | Discharge: 2013-11-27 | Disposition: A | Discharge status: Home / Self Care | Payer: BC Managed Care – PPO | Source: Ambulatory Visit | Attending: Surgery | Admitting: Surgery

## 2013-11-27 DIAGNOSIS — E049 Nontoxic goiter, unspecified: Secondary | ICD-10-CM

## 2013-12-04 ENCOUNTER — Other Ambulatory Visit (HOSPITAL_COMMUNITY)
Admission: RE | Admit: 2013-12-04 | Discharge: 2013-12-04 | Disposition: A | Discharge status: Home / Self Care | Payer: BC Managed Care – PPO | Source: Ambulatory Visit | Attending: Nurse Practitioner | Admitting: Nurse Practitioner

## 2013-12-04 ENCOUNTER — Other Ambulatory Visit: Payer: Self-pay | Admitting: Nurse Practitioner

## 2013-12-04 DIAGNOSIS — Z01419 Encounter for gynecological examination (general) (routine) without abnormal findings: Secondary | ICD-10-CM | POA: Insufficient documentation

## 2013-12-04 DIAGNOSIS — Z1151 Encounter for screening for human papillomavirus (HPV): Secondary | ICD-10-CM | POA: Insufficient documentation

## 2013-12-04 DIAGNOSIS — Z113 Encounter for screening for infections with a predominantly sexual mode of transmission: Secondary | ICD-10-CM | POA: Insufficient documentation

## 2013-12-05 ENCOUNTER — Telehealth (INDEPENDENT_AMBULATORY_CARE_PROVIDER_SITE_OTHER): Payer: Self-pay

## 2013-12-05 NOTE — Telephone Encounter (Signed)
Gave US results to pt. US stable; no change in goiter.

## 2013-12-05 NOTE — Telephone Encounter (Signed)
Message copied by Brennan BaileyBROOKS, Monifa Blanchette on Wed Dec 05, 2013  9:35 AM ------      Message from: Harriette BouillonORNETT, THOMAS A      Created: Tue Nov 27, 2013  3:00 PM       STABLE FOLLOW UP 6 MONTHS ------

## 2014-01-21 ENCOUNTER — Other Ambulatory Visit: Payer: Self-pay | Admitting: Internal Medicine

## 2014-01-21 DIAGNOSIS — N6310 Unspecified lump in the right breast, unspecified quadrant: Secondary | ICD-10-CM

## 2014-02-27 ENCOUNTER — Other Ambulatory Visit: Payer: BC Managed Care – PPO

## 2014-03-01 ENCOUNTER — Ambulatory Visit
Admission: RE | Admit: 2014-03-01 | Discharge: 2014-03-01 | Disposition: A | Discharge status: Home / Self Care | Payer: BC Managed Care – PPO | Source: Ambulatory Visit | Attending: Internal Medicine | Admitting: Internal Medicine

## 2014-03-01 DIAGNOSIS — N6310 Unspecified lump in the right breast, unspecified quadrant: Secondary | ICD-10-CM

## 2014-04-07 ENCOUNTER — Encounter (HOSPITAL_COMMUNITY): Payer: Self-pay | Admitting: Emergency Medicine

## 2014-04-07 ENCOUNTER — Emergency Department (HOSPITAL_COMMUNITY)
Admission: EM | Admit: 2014-04-07 | Discharge: 2014-04-07 | Disposition: A | Discharge status: Home / Self Care | Payer: BC Managed Care – PPO | Source: Home / Self Care | Attending: Emergency Medicine | Admitting: Emergency Medicine

## 2014-04-07 DIAGNOSIS — K121 Other forms of stomatitis: Secondary | ICD-10-CM

## 2014-04-07 DIAGNOSIS — K1230 Oral mucositis (ulcerative), unspecified: Secondary | ICD-10-CM

## 2014-04-07 HISTORY — DX: Unspecified ectopic pregnancy without intrauterine pregnancy: O00.90

## 2014-04-07 LAB — GLUCOSE, CAPILLARY: Glucose-Capillary: 91 mg/dL (ref 70–99)

## 2014-04-07 MED ORDER — MAGIC MOUTHWASH: ORAL | Status: DC

## 2014-04-07 NOTE — ED Provider Notes (Signed)
  Chief Complaint   Chief Complaint  Patient presents with  . Thrush    History of Present Illness   Veronica Contreras is a 38 year old female who has had a six-day history of dry throat, dry mouth, and dry to it. There is a white coating on her tongue the tongue feels sore. Her palate is sore. She's being treated for yeast with fluconazole tablets. She's taken 2 of these. She also had asked reflux and takes pantoprazole. She denies any sore throat or pain on swallowing. There no intraoral lesions. No fever, chills, headache, skin rash, or adenopathy.  Review of Systems   Other than as noted above, the patient denies any of the following symptoms: Systemic:  No fevers or chills. Eye:  No redness, pain, discharge, itching, blurred vision, or diplopia. ENT:  No headache, nasal congestion, sneezing, itching, epistaxis, ear pain, decreased hearing, ringing in ears, vertigo, or tinnitus.  No oral lesions, sore throat, or hoarseness. Neck:  No neck pain or adenopathy. Skin:  No rash or itching.  PMFSH   Past medical history, family history, social history, meds, and allergies were reviewed.   Physical Examination     Vital signs:  BP 112/70  Pulse 67  Temp(Src) 98.2 F (36.8 C) (Oral)  Resp 16  SpO2 98%  LMP 04/06/2014 General:  Alert and oriented.  In no distress.  Skin warm and dry. Eye:  PERRL, full EOMs, lids and conjunctiva normal.   ENT:  TMs and canals clear.  Nasal mucosa not congested and without drainage.  Mucous membranes moist, no oral lesions, normal dentition, pharynx clear.  No cranial or facial pain to palplation. Her oral cavity appears completely normal. There is no evidence of yeast infection. No ulcerations. No coating of the tongue, no erythema of the pharynx. Neck:  Supple, full ROM.  No adenopathy, tenderness or mass.  Thyroid normal. Lungs:  Breath sounds clear and equal bilaterally.  No wheezes, rales or rhonchi. Heart:  Rhythm regular, without extrasystoles.   No gallops or murmers. Skin:  Clear, warm and dry.  Labs   Results for orders placed during the hospital encounter of 04/07/14  GLUCOSE, CAPILLARY      Result Value Ref Range   Glucose-Capillary 91  70 - 99 mg/dL    Assessment   The encounter diagnosis was Stomatitis.  Of unknown cause. There is no evidence of yeast infection or herpes simplex virus.  Plan    1.  Meds:  The following meds were prescribed:   Discharge Medication List as of 04/07/2014  9:39 AM    START taking these medications   Details  Alum & Mag Hydroxide-Simeth (MAGIC MOUTHWASH) SOLN 1 tsp QID, hold in mouth 5 minutes, then swallow, Normal        2.  Patient Education/Counseling:  The patient was given appropriate handouts, self care instructions, and instructed in symptomatic relief.  Suggested B. vitamin supplement.  3.  Follow up:  The patient was told to follow up here if no better in 3 to 4 days, or sooner if becoming worse in any way, and given some red flag symptoms such as sores in the mouth, difficulty swallowing, or fever which would prompt immediate return.  Followup with [facility] if no improvement in one week.     Reuben Likes, MD 04/07/14 2215

## 2014-04-07 NOTE — Discharge Instructions (Signed)
Stomatitis Stomatitis is an inflammation of the mucous lining of the mouth. It can affect part of the mouth or the whole mouth. The intensity of symptoms can range from mild to severe. It can affect your cheek, teeth, gums, lips, or tongue. In almost all cases, the lining of the mouth becomes swollen, red, and painful. Painful ulcers can develop in your mouth. Stomatitis recurs in some people. CAUSES  There are many common causes of stomatitis. They include:  Viruses (such as cold sores or shingles).  Canker sores.  Bacteria (such as ulcerative gingivitis or sexually transmitted diseases).  Fungus or yeast (such as candidiasis or oral thrush).  Poor oral hygiene and poor nutrition (Vincent's stomatitis or trench mouth).  Lack of vitamin B, vitamin C, or niacin.  Dentures or braces that do not fit properly.  High acid foods (uncommon).  Sharp or broken teeth.  Cheek biting.  Breathing through the mouth.  Chewing tobacco.  Allergy to toothpaste, mouthwash, candy, gum, lipstick, or some medicines.  Burning your mouth with hot drinks or food.  Exposure to dyes, heavy metals, acid fumes, or mineral dust. SYMPTOMS   Painful ulcers in the mouth.  Blisters in the mouth.  Bleeding gums.  Swollen gums.  Irritability.  Bad breath.  Bad taste in the mouth.  Fever.  Trouble eating because of burning and pain in the mouth. DIAGNOSIS  Your caregiver will examine your mouth and look for bleeding gums and mouth ulcers. Your caregiver may ask you about the medicines you are taking. Your caregiver may suggest a blood test and tissue sample (biopsy) of the mouth ulcer or mass if either is present. This will help find the cause of your condition. TREATMENT  Your treatment will depend on the cause of your condition. Your caregiver will first try to treat your symptoms.   You may be given pain medicine. Topical anesthetic may be used to numb the area if you have severe  pain.  Your caregiver may prescribe antibiotic medicine if you have a bacterial infection.  Your caregiver may prescribe antifungal medicine if you have a fungal infection.  You may need to take antiviral medicine if you have a viral infection like herpes.  You may be asked to use medicated mouth rinses.  Your caregiver will advise you about proper brushing and using a soft toothbrush. You also need to get your teeth cleaned regularly. HOME CARE INSTRUCTIONS   Maintain good oral hygiene. This is especially important for transplant patients.  Brush your teeth carefully with a soft, nylon-bristled toothbrush.  Floss at least 2 times a day.  Clean your mouth after eating.  Rinse your mouth with salt water 3 to 4 times a day.  Gargle with cold water.  Use topical numbing medicines to decrease pain if recommended by your caregiver.  Stop smoking, and stop using chewing or smokeless tobacco.  Avoid eating hot and spicy foods.  Eat soft and bland food.  Reduce your stress wherever possible.  Eat healthy and nutritious foods. SEEK MEDICAL CARE IF:   Your symptoms persist or get worse.  You develop new symptoms.  Your mouth ulcers are present for more than 3 weeks.  Your mouth ulcers come back frequently.  You have increasing difficulty with normal eating and drinking.  You have increasing fatigue or weakness.  You develop loss of appetite or nausea. SEEK IMMEDIATE MEDICAL CARE IF:   You have a fever.  You develop pain, redness, or sores around one or both   eyes.  You cannot eat or drink because of pain or other symptoms.  You develop worsening weakness, or you faint.  You develop vomiting or diarrhea.  You develop chest pain, shortness of breath, or rapid and irregular heartbeats. MAKE SURE YOU:  Understand these instructions.  Will watch your condition.  Will get help right away if you are not doing well or get worse. Document Released: 08/29/2007  Document Revised: 01/24/2012 Document Reviewed: 06/10/2011 ExitCare Patient Information 2014 [employer].  

## 2014-04-07 NOTE — ED Notes (Signed)
Pt believes she may have thrush; started with dry mouth, "funny feeling tongue" 6 days ago; was treated for vaginal yeast infection this past week with 2 doses fluconazole - last dose yesterday.  States mouth sensations just haven't improved.

## 2014-05-24 ENCOUNTER — Ambulatory Visit (INDEPENDENT_AMBULATORY_CARE_PROVIDER_SITE_OTHER): Payer: BC Managed Care – PPO | Admitting: Surgery

## 2014-05-24 ENCOUNTER — Encounter (INDEPENDENT_AMBULATORY_CARE_PROVIDER_SITE_OTHER): Payer: Self-pay | Admitting: Surgery

## 2014-05-24 VITALS — BP 112/76 | HR 62 | Resp 12 | Ht 65.3 in | Wt 222.2 lb

## 2014-05-24 DIAGNOSIS — E049 Nontoxic goiter, unspecified: Secondary | ICD-10-CM

## 2014-05-24 NOTE — Patient Instructions (Signed)
Thyroidectomy Thyroidectomy is the removal of part or all of your thyroid gland. Your thyroid gland is a butterfly-shaped gland at the base of your neck. It produces a substance called thyroid hormone, which regulates the physical and chemical processes that keep your body functioning and make energy available to your body (metabolism). The amount of thyroid gland tissue that is removed during a thyroidectomy depends on the reason for the procedure. Typically, if only a part of your gland is removed, enough thyroid gland tissue remains to maintain normal function. If your entire thyroid gland is removed or if the amount of thyroid gland tissue remaining is inadequate to maintain normal function, you will need life-long treatment with thyroid hormone on a daily basis. Thyroidectomy maybe performed when you have the following conditions:  Thyroid nodules. These are small, abnormal collections of tissue that form inside the thyroid gland. If these nodules begin to enlarge at a rapid rate, a sample of tissue from the nodule is taken through a needle and examined (needle biopsy). This is done to determine if the nodules are cancerous. Depending on the outcome of this exam, thyroidectomy may be necessary.  Thyroid cancer.  Goiter, which is an enlarged thyroid gland. All or part of the thyroid gland may be removed if the gland has become so large that it causes difficulty breathing or swallowing.  Hyperthyroidism. This is when the thyroid gland produces too much thyroid hormone. Hypothyroidism can cause symptoms of fluctuating weight, intolerance to heat, irritability, shortness of breath, and chest pain. LET YOUR CAREGIVER KNOW ABOUT:   Allergies to food or medicine.  Medicines that you are taking, including vitamins, herbs, eyedrops, over-the-counter medicines, and creams.  Previous problems you have had with anesthetics or numbing medicines.  History of bleeding problems or blood clots.  Previous  surgeries you have had.  Other health problems, including diabetes and kidney problems, you have had.  Possibility of pregnancy, if this applies. BEFORE THE PROCEDURE   Do not eat or drink anything, including water, for at least 6 hours before the procedure.  Ask your caregiver whether you should stop taking certain medicines before the day of the procedure. PROCEDURE  There are different ways that thyroidectomy is performed. For each type, you will be given a medicine to make you sleep (general anesthetic). The three main types of thyroidectomy are listed as follows:  Conventional thyroidectomy--A cut (incision) in the center portion of your lower neck is made with a scalpel. Muscles below your skin are separated to gain access to your thyroid gland. Your thyroid gland is dissected from your windpipe (trachea). Often a drain is placed at the incision site to drain any blood that accumulates under the skin after the procedure. This drain will be removed before you go home. The wound from the incision should heal within 2 weeks.  Endoscopic thyroidectomy--Small incisions are made in your lower neck. A small instrument (endoscope) is inserted under your skin at the incision sites. The endoscope used for thyroidectomy consists of 2 flexible tubes. Inside one of the tubes is a video camera that is used to guide the surgeon. Tools to remove the thyroid gland, including a tool to cut the gland (dissectors) and a suction device, are inserted through the other tube. The surgeon uses the dissectors to dissect the thyroid gland from the trachea and remove it.  Robotic thyroidectomy--This procedure allows your thyroid gland to be removed through incisions in your armpit, your chest, or high in your neck. Instruments similar   to endoscopes provide a 3-dimensional picture of the surgical site. Dissecting instruments are controlled by devices similar to joysticks. These devices allow more accurate manipulation of  the instruments. After the blood supply to the gland is removed, the gland is cut into several pieces and removed through the incisions. °RISKS AND COMPLICATIONS °Complications associated with thyroidectomy are rare, but they can occur. Possible complications include: °· A decrease in parathyroid hormone levels (hypoparathyroidism)--Your parathyroid glands are located close behind your thyroid gland. They are responsible for maintaining calcium levels inthe body. If they are damaged or removed, levels of calcium in the blood become low and nerves become irritable, which can cause muscle spasms. Medicines are available to treat this. °· Bacterial infection--This can often be treated with medicines that kill bacteria (antibiotics). °· Damage to your voice box nerves--This could cause hoarseness or complete loss of voice. °· Bleeding or airway obstruction. °AFTER THE PROCEDURE  °· You will rest in the recovery room as you wake up. °· When you first wake up, your throat may feel slightly sore. °· You will not be allowed to eat or drink until instructed otherwise. °· You will be taken to your hospital room. You will usually stay at the hospital for 1 or 2 nights. °· If a drain is placed during the procedure, it usually is removed the next day. °· You may have some mild neck pain. °· Your voice may be weak. This usually is temporary. °Document Released: 04/27/2001 Document Revised: 02/26/2013 Document Reviewed: 02/03/2011 °ExitCare® Patient Information ©2015 [employer]. This information is not intended to replace advice given to you by your health care provider. Make sure you discuss any questions you have with your health care provider. ° °

## 2014-05-24 NOTE — Progress Notes (Signed)
Patient ID: Veronica Contreras, female   DOB: 1976-09-22, 38 y.o.   MRN: [id number]  No chief complaint on file.   HPI Veronica Contreras is a 38 y.o. female.  Patient sent a request of Dr. Barbaraann Barthelankins do to goiter. She is developed fullness in her neck and a choking sensation over the last year. Ultrasound was done which showed enlarged gland and a dominant right thyroid lobe nodule measuring 3 cm and a 1.2 cm left thyroid lobe mass. Advise any change in her weight, palpitations or appetite. Does have mild discomfort with swallowing especially popcorn. HPI Pt returns in follow up.  Still having fullness in neck and is ready for thyroidectomy.  Symptoms no better over last year.  Past Medical History  Diagnosis Date  . Acid reflux   . Ectopic pregnancy     Past Surgical History  Procedure Laterality Date  . Laparoscopy for ectopic pregnancy      Salpingectomy  . Salpingectomy      R/T ectopic pregnancy    Family History  Problem Relation Age of Onset  . Cancer Maternal Aunt     breast    Social History History  Substance Use Topics  . Smoking status: Former Smoker    Quit date: 05/28/2012  . Smokeless tobacco: Never Used  . Alcohol Use: No    Allergies  Allergen Reactions  . Vicodin [Hydrocodone-Acetaminophen] Nausea And Vomiting    Current Outpatient Prescriptions  Medication Sig Dispense Refill  . doxycycline (DORYX) 150 MG EC tablet       . ibuprofen (ADVIL,MOTRIN) 200 MG tablet Take 400 mg by mouth every 8 (eight) hours as needed. For pain.      Marland Kitchen. PANTOPRAZOLE SODIUM PO Take by mouth.       No current facility-administered medications for this visit.    Review of Systems Review of Systems  Constitutional: Negative for fever, chills and unexpected weight change.  HENT: Negative for hearing loss, congestion, sore throat, trouble swallowing and voice change.   Eyes: Negative for visual disturbance.  Respiratory: Negative for cough and wheezing.   Cardiovascular:  Negative for chest pain, palpitations and leg swelling.  Gastrointestinal: Negative for nausea, vomiting, abdominal pain, diarrhea, constipation, blood in stool, abdominal distention and anal bleeding.  Genitourinary: Negative for hematuria, vaginal bleeding and difficulty urinating.  Musculoskeletal: Negative for arthralgias.  Skin: Negative for rash and wound.  Neurological: Negative for seizures, syncope and headaches.  Hematological: Negative for adenopathy. Does not bruise/bleed easily.  Psychiatric/Behavioral: Negative for confusion.    Blood pressure 112/76, pulse 62, resp. rate 12, height 5' 5.3" (1.659 m), weight 222 lb 3.2 oz (100.789 kg).  Physical Exam Physical Exam  Constitutional: She is oriented to person, place, and time. She appears well-developed and well-nourished.  HENT:  Head: Normocephalic and atraumatic.  Eyes: EOM are normal. Pupils are equal, round, and reactive to light.  Neck: Trachea normal, normal range of motion and phonation normal. Thyromegaly present. No mass present.  Pulmonary/Chest: No stridor.  Musculoskeletal: Normal range of motion.  Lymphadenopathy:    She has no cervical adenopathy.  Neurological: She is alert and oriented to person, place, and time.  Skin: Skin is warm and dry.  Psychiatric: She has a normal mood and affect. Her behavior is normal. Judgment and thought content normal.    Data Reviewed Clinical Data: Thyroid goiter and dysphagia.  THYROID ULTRASOUND  Technique: Ultrasound examination of the thyroid gland and adjacent  soft tissues was performed.  Comparison:  None.  Findings:  Right thyroid lobe: 7.4 x 2.3 x 3.1 cm  Left thyroid lobe: 6.7 x 2.1 x 2.7 cm  Isthmus: 1.5 cm  Focal nodules: There is a dominant nodule in the inferior aspect  of the right globe which appears solid and measures approximately  3.4 x 2.1 x 2.9 cm. This nodule contains no internal  calcifications and is not associated with increased vascularity.   Small solid and partially cystic nodule in the inferior left lobe  measures 1.2 x 1.0 x 1.2 cm.  Lymphadenopathy: None visualized.  IMPRESSION:  Moderate thyroid enlargement. Dominant right thyroid nodule is  identified measuring just over 3 cm in greatest diameter. A  smaller 1.2 cm nodule is present in the inferior left lobe.  Sonographic follow-up is recommended. Biopsy of the dominant  nodule could also be considered given size.  Findings meet consensus criteria for biopsy. Ultrasound-guided  fine needle aspiration should be considered of the dominant nodule,  as per the consensus statement: Management of Thyroid Nodules  Detected at [place]: Society of Radiologists in Ultrasound Consensus  Conference Statement. Radiology 2005; [id number].   Assessment    Goiter Right thyroid nodule 3 cm Left thyroid nodule 1.3 cm   FNA benign 2014 Plan   Pt having more symptoms of fullness and pressure and desires total  thyroidectomy.  The procedure has been discussed with the patient.  Alternative therapies have been discussed with the patient.  Operative risks include bleeding, nerve injury, hoarseness.  Low calcium, airway compromise,   Infection,  Organ injury,  Nerve injury,  Blood vessel injury,  DVT,  Pulmonary embolism,  Death,  And possible reoperation.  Medical management risks include worsening of present situation.  The success of the procedure is 50 -90 % at treating patients symptoms.   Medical options discussed. The patient understands and agrees to proceed.   Leland Staszewski A. 05/24/2014, 12:47 PM

## 2014-08-30 ENCOUNTER — Other Ambulatory Visit: Payer: Self-pay

## 2014-09-16 ENCOUNTER — Encounter (INDEPENDENT_AMBULATORY_CARE_PROVIDER_SITE_OTHER): Payer: Self-pay | Admitting: Surgery

## 2015-01-22 IMAGING — US US SOFT TISSUE HEAD/NECK
1 series · 13 of 25 positions shown · non-contrast
Comparison: None.

CLINICAL DATA: Thyroid goiter and dysphagia.

THYROID ULTRASOUND
TECHNIQUE: Ultrasound examination of the thyroid gland and adjacent
soft tissues was performed.

[Series 1: us soft tissue head/neck · 0.09mm/px · 13 of 44 slices shown]
[im 1/44]
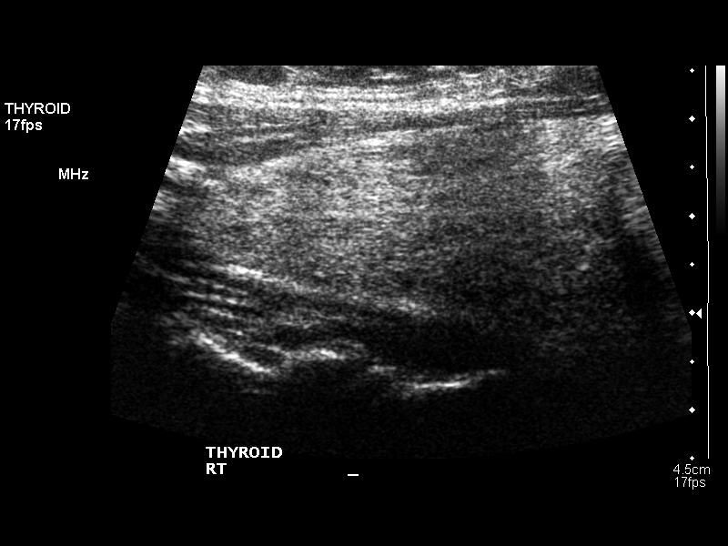
[im 4/44]
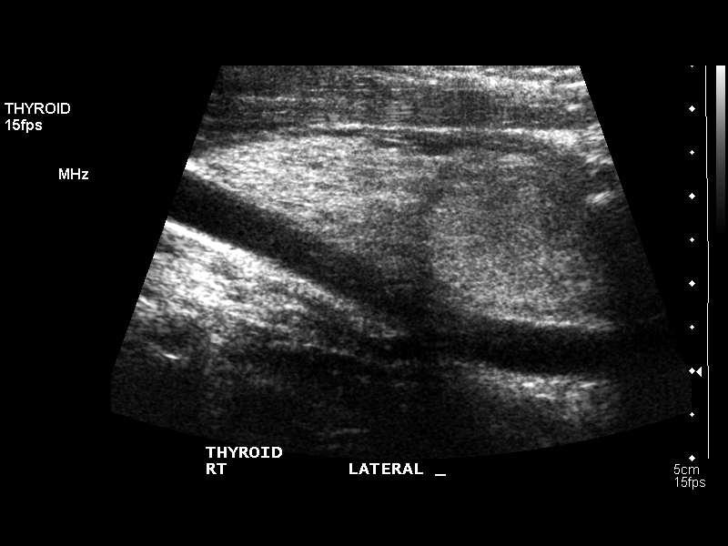
[im 8/44]
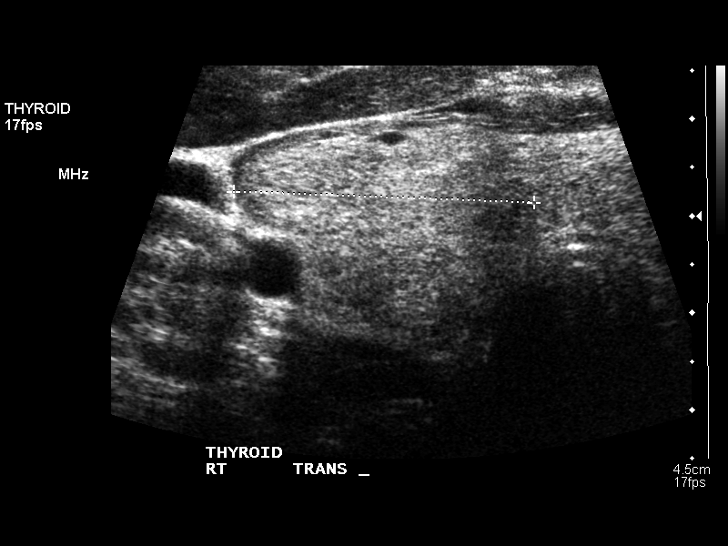
[im 11/44]
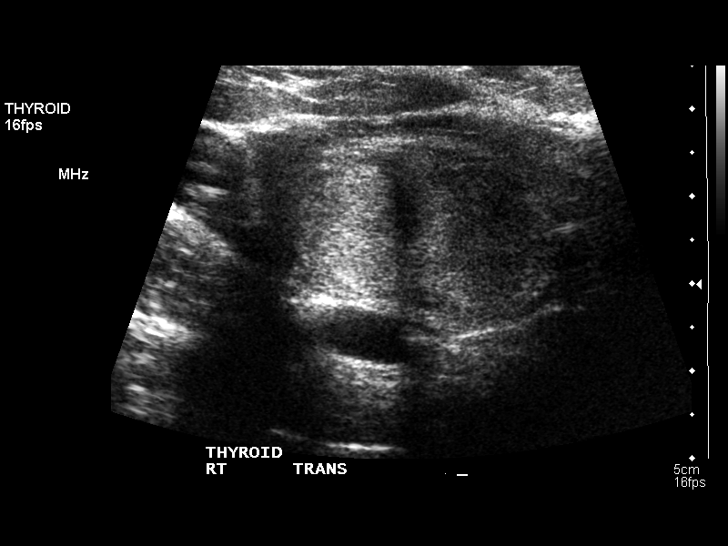
[im 15/44]
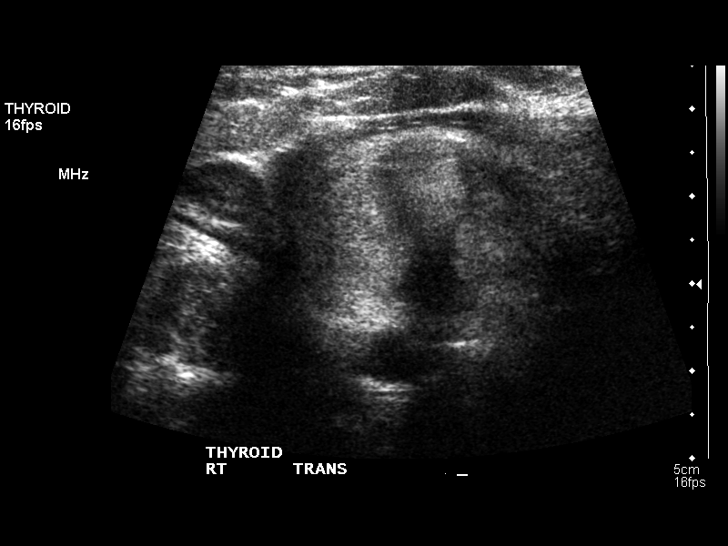
[im 18/44]
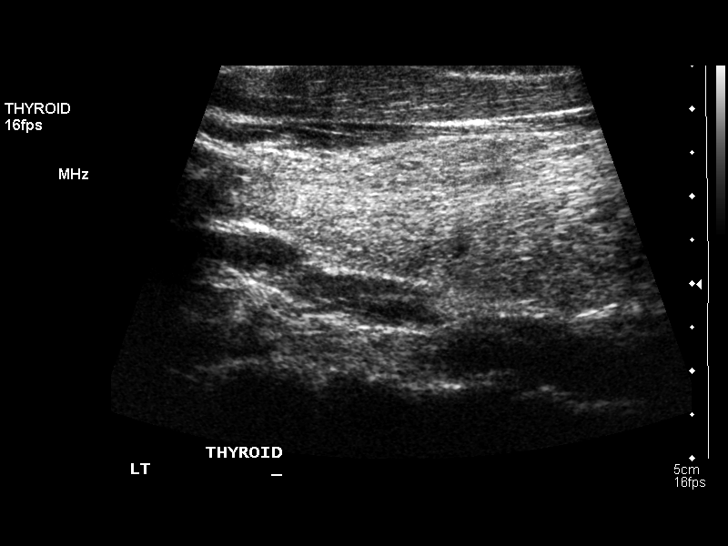
[im 22/44]
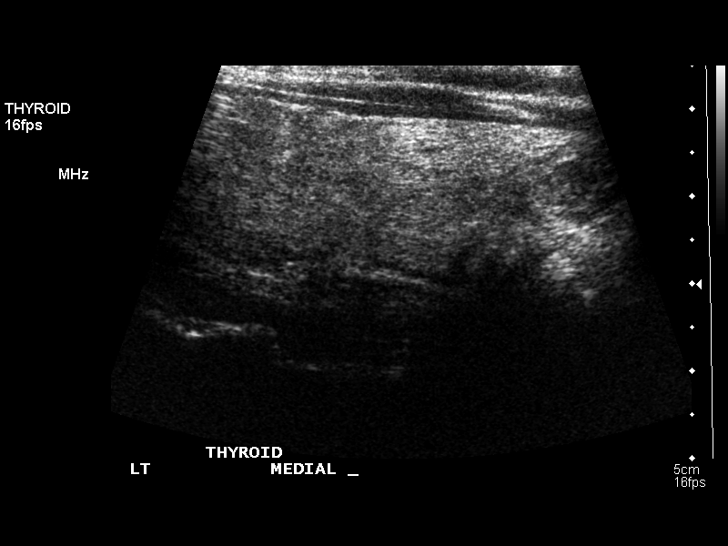
[im 26/44]
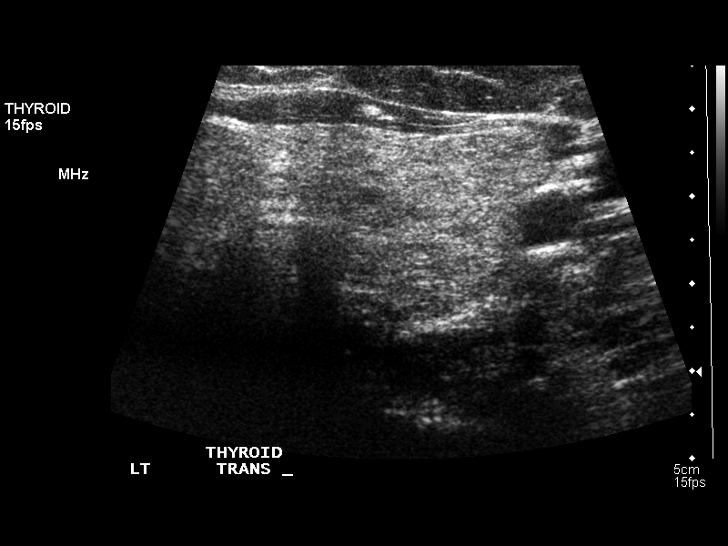
[im 29/44]
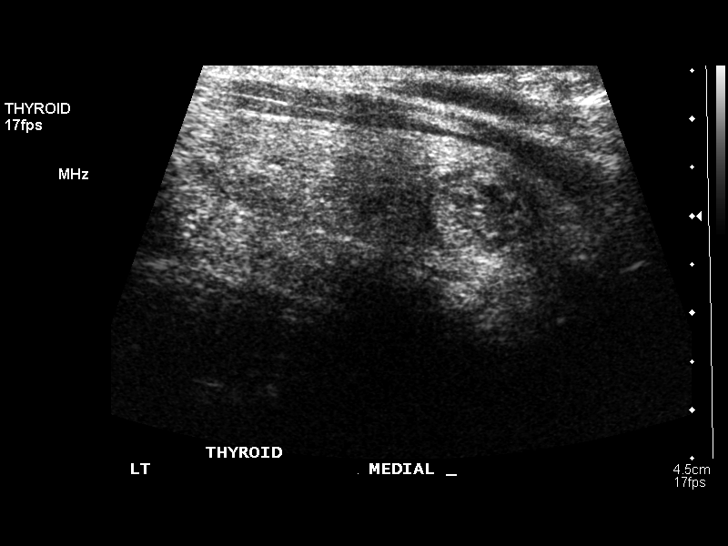
[im 33/44]
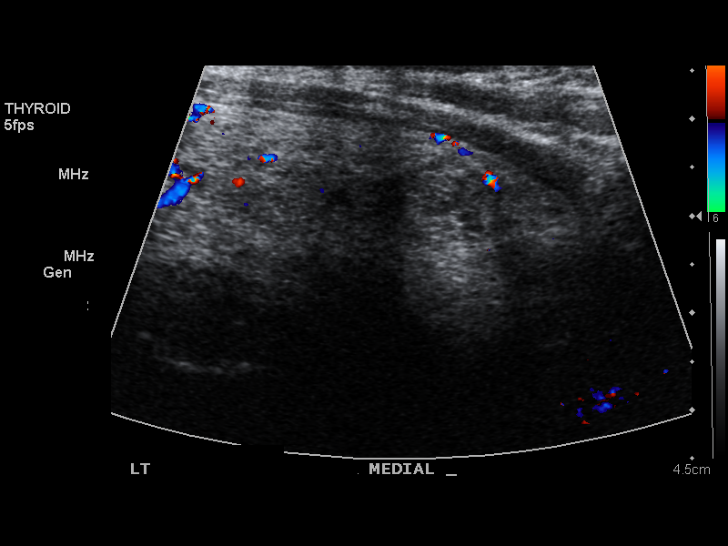
[im 36/44]
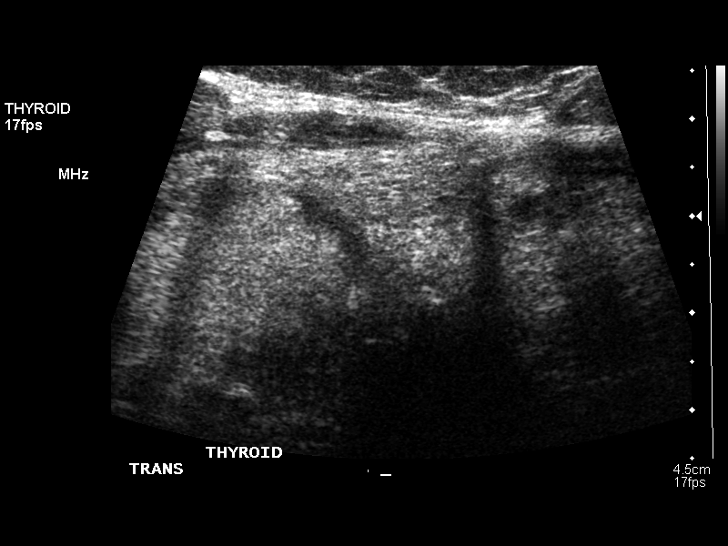
[im 40/44]
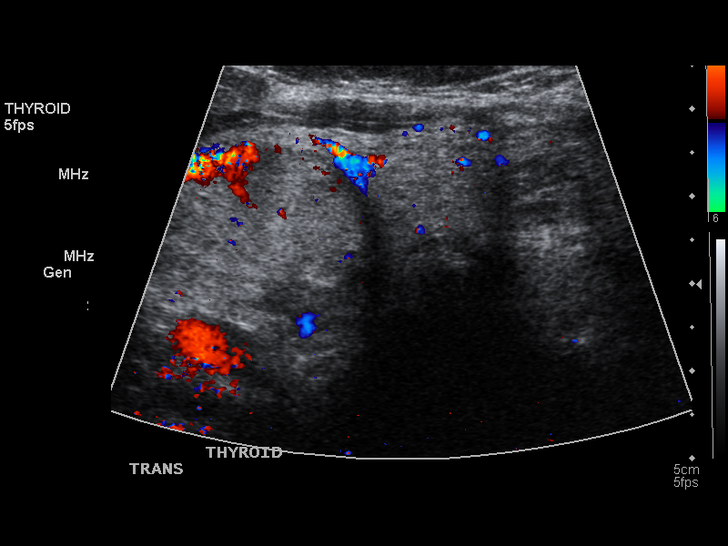
[im 44/44]
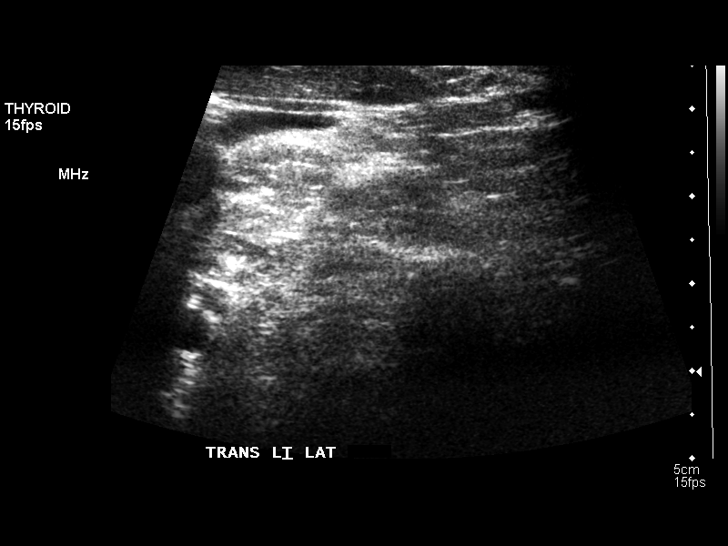

[13 of 25 positions shown; findings below may reference images not displayed]

FINDINGS: Right thyroid lobe:  7.4 x 2.3 x 3.1 cm
Left thyroid lobe:  6.7 x 2.1 x 2.7 cm
Isthmus:  1.5 cm

Focal nodules:  There is a dominant nodule in the inferior aspect
of the right globe which appears solid and measures approximately
3.4 x 2.1 x 2.9 cm.  This nodule contains no internal
calcifications and is not associated with increased vascularity.
Small solid and partially cystic nodule in the inferior left lobe
measures 1.2 x 1.0 x 1.2 cm.

Lymphadenopathy:  None visualized.
IMPRESSION: Moderate thyroid enlargement.  Dominant right thyroid nodule is
identified measuring just over 3 cm in greatest diameter.  A
smaller 1.2 cm nodule is present in the inferior left lobe.
Sonographic follow-up is recommended.  Biopsy of the dominant
nodule could also be considered given size.

Findings meet consensus criteria for biopsy.  Ultrasound-guided
fine needle aspiration should be considered of the dominant nodule,
as per the consensus statement: Management of Thyroid Nodules
Detected at [place]:  Society of Radiologists in Ultrasound Consensus

## 2015-02-02 ENCOUNTER — Emergency Department (INDEPENDENT_AMBULATORY_CARE_PROVIDER_SITE_OTHER): Payer: Self-pay

## 2015-02-02 ENCOUNTER — Emergency Department (INDEPENDENT_AMBULATORY_CARE_PROVIDER_SITE_OTHER)
Admission: EM | Admit: 2015-02-02 | Discharge: 2015-02-02 | Disposition: A | Discharge status: Home / Self Care | Payer: Self-pay | Source: Home / Self Care | Attending: Family Medicine | Admitting: Family Medicine

## 2015-02-02 ENCOUNTER — Encounter (HOSPITAL_COMMUNITY): Payer: Self-pay | Admitting: *Deleted

## 2015-02-02 DIAGNOSIS — S92351A Displaced fracture of fifth metatarsal bone, right foot, initial encounter for closed fracture: Secondary | ICD-10-CM

## 2015-02-02 MED ORDER — IBUPROFEN 800 MG PO TABS
ORAL_TABLET | ORAL | Status: AC
  Filled 2015-02-02: qty 1

## 2015-02-02 MED ORDER — TRAMADOL HCL 50 MG PO TABS: 50.0000 mg | ORAL_TABLET | Freq: Three times a day (TID) | ORAL | Status: DC | PRN

## 2015-02-02 MED ORDER — IBUPROFEN 800 MG PO TABS
800.0000 mg | ORAL_TABLET | Freq: Once | ORAL | Status: AC
  Administered 2015-02-02: 800 mg via ORAL

## 2015-02-02 NOTE — Progress Notes (Signed)
Orthopedic Tech Progress Note Patient Details:  Veronica Contreras December 13, 1975 [id number]  Ortho Devices Type of Ortho Device: Ace wrap, Crutches, Post (short leg) splint, Stirrup splint Ortho Device/Splint Location: rle Ortho Device/Splint Interventions: Application   Kilani Joffe 02/02/2015, 2:20 PM

## 2015-02-02 NOTE — Discharge Instructions (Signed)
Discussed implications of Jones fracture and increased potential for nonunion with this type of injury. Advised need for close follow up with her orthopedist or ortho on call (Dr. Eulah PontMurphy). Patient placed in short leg posterior splint with stirrup and provided crutches and instructed to remain non-weight bearing until follow up with ortho. Tramadol Rx for pain. Instructions regarding splint care and RICE therapy at home given to patient.  Cast or Splint Care Casts and splints support injured limbs and keep bones from moving while they heal. It is important to care for your cast or splint at home.  HOME CARE INSTRUCTIONS  Keep the cast or splint uncovered during the drying period. It can take 24 to 48 hours to dry if it is made of plaster. A fiberglass cast will dry in less than 1 hour.  Do not rest the cast on anything harder than a pillow for the first 24 hours.  Do not put weight on your injured limb or apply pressure to the cast until your health care provider gives you permission.  Keep the cast or splint dry. Wet casts or splints can lose their shape and may not support the limb as well. A wet cast that has lost its shape can also create harmful pressure on your skin when it dries. Also, wet skin can become infected.  Cover the cast or splint with a plastic bag when bathing or when out in the rain or snow. If the cast is on the trunk of the body, take sponge baths until the cast is removed.  If your cast does become wet, dry it with a towel or a blow dryer on the cool setting only.  Keep your cast or splint clean. Soiled casts may be wiped with a moistened cloth.  Do not place any hard or soft foreign objects under your cast or splint, such as cotton, toilet paper, lotion, or powder.  Do not try to scratch the skin under the cast with any object. The object could get stuck inside the cast. Also, scratching could lead to an infection. If itching is a problem, use a blow dryer on a cool  setting to relieve discomfort.  Do not trim or cut your cast or remove padding from inside of it.  Exercise all joints next to the injury that are not immobilized by the cast or splint. For example, if you have a long leg cast, exercise the hip joint and toes. If you have an arm cast or splint, exercise the shoulder, elbow, thumb, and fingers.  Elevate your injured arm or leg on 1 or 2 pillows for the first 1 to 3 days to decrease swelling and pain.It is best if you can comfortably elevate your cast so it is higher than your heart. SEEK MEDICAL CARE IF:   Your cast or splint cracks.  Your cast or splint is too tight or too loose.  You have unbearable itching inside the cast.  Your cast becomes wet or develops a soft spot or area.  You have a bad smell coming from inside your cast.  You get an object stuck under your cast.  Your skin around the cast becomes red or raw.  You have new pain or worsening pain after the cast has been applied. SEEK IMMEDIATE MEDICAL CARE IF:   You have fluid leaking through the cast.  You are unable to move your fingers or toes.  You have discolored (blue or white), cool, painful, or very swollen fingers or toes beyond  the cast.  You have tingling or numbness around the injured area.  You have severe pain or pressure under the cast.  You have any difficulty with your breathing or have shortness of breath.  You have chest pain. Document Released: 10/29/2000 Document Revised: 08/22/2013 Document Reviewed: 05/10/2013 [facility] Patient Information 2015 [place], [place]. This information is not intended to replace advice given to you by your health care provider. Make sure you discuss any questions you have with your health care provider.  Metatarsal Fracture, Undisplaced A metatarsal fracture is a break in the bone(s) of the foot. These are the bones of the foot that connect your toes to the bones of the ankle. DIAGNOSIS  The diagnoses of these  fractures are usually made with X-rays. If there are problems in the forefoot and x-rays are normal a later bone scan will usually make the diagnosis.  TREATMENT AND HOME CARE INSTRUCTIONS  Treatment may or may not include a cast or walking shoe. When casts are needed the use is usually for short periods of time so as not to slow down healing with muscle wasting (atrophy).  Activities should be stopped until further advised by your caregiver.  Wear shoes with adequate shock absorbing capabilities and stiff soles.  Alternative exercise may be undertaken while waiting for healing. These may include bicycling and swimming, or as your caregiver suggests.  It is important to keep all follow-up visits or specialty referrals. The failure to keep these appointments could result in improper bone healing and chronic pain or disability.  Warning: Do not drive a car or operate a motor vehicle until your caregiver specifically tells you it is safe to do so. IF YOU DO NOT HAVE A CAST OR SPLINT:  You may walk on your injured foot as tolerated or advised.  Do not put any weight on your injured foot for as long as directed by your caregiver. Slowly increase the amount of time you walk on the foot as the pain allows or as advised.  Use crutches until you can bear weight without pain. A gradual increase in weight bearing may help.  Apply ice to the injury for 15-20 minutes each hour while awake for the first 2 days. Put the ice in a plastic bag and place a towel between the bag of ice and your skin.  Only take over-the-counter or prescription medicines for pain, discomfort, or fever as directed by your caregiver. SEEK IMMEDIATE MEDICAL CARE IF:   Your cast gets damaged or breaks.  You have continued severe pain or more swelling than you did before the cast was put on, or the pain is not controlled with medications.  Your skin or nails below the injury turn blue or grey, or feel cold or numb.  There is  a bad smell, or new stains or pus-like (purulent) drainage coming from the cast. MAKE SURE YOU:   Understand these instructions.  Will watch your condition.  Will get help right away if you are not doing well or get worse. Document Released: 07/24/2002 Document Revised: 01/24/2012 Document Reviewed: 06/14/2008 [facility] Patient Information 2015 [place], [place]. This information is not intended to replace advice given to you by your health care provider. Make sure you discuss any questions you have with your health care provider.

## 2015-02-02 NOTE — ED Notes (Signed)
Pt given 2 blankets.

## 2015-02-02 NOTE — ED Provider Notes (Signed)
CSN: [id number]     Arrival date & time 02/02/15  1138 History   First MD Initiated Contact with Patient 02/02/15 1218     Chief Complaint  Patient presents with  . Foot Injury   (Consider location/radiation/quality/duration/timing/severity/associated sxs/prior Treatment) HPI Comments: Patient states she was ironing clothes yesterday at home and iron began to fall and she twisted abruptly to avoid having iron fall onto her right foot and felt "a pop" at her right lateral foot and has had pain at right lateral foot with weight bearing since injury. No previous injury or surgery to right foot.   Patient is a 39 y.o. female presenting with foot injury. The history is provided by the patient.  Foot Injury Location:  Foot Time since incident:  1 day Injury: yes   Foot location:  R foot   Past Medical History  Diagnosis Date  . Acid reflux   . Ectopic pregnancy    Past Surgical History  Procedure Laterality Date  . Laparoscopy for ectopic pregnancy      Salpingectomy  . Salpingectomy      R/T ectopic pregnancy   Family History  Problem Relation Age of Onset  . Cancer Maternal Aunt     breast  . Hypertension Mother    History  Substance Use Topics  . Smoking status: Former Smoker    Quit date: 05/28/2012  . Smokeless tobacco: Never Used  . Alcohol Use: No   OB History    Gravida Para Term Preterm AB TAB SAB Ectopic Multiple Living   1    1   1   0     Review of Systems  All other systems reviewed and are negative.   Allergies  Vicodin  Home Medications   Prior to Admission medications   Medication Sig Start Date End Date Taking? Authorizing Provider  ibuprofen (ADVIL,MOTRIN) 200 MG tablet Take 400 mg by mouth every 8 (eight) hours as needed. For pain.   Yes Historical Provider, MD  doxycycline (DORYX) 150 MG EC tablet  05/07/14   Historical Provider, MD  PANTOPRAZOLE SODIUM PO Take by mouth.    Historical Provider, MD  traMADol (ULTRAM) 50 MG tablet Take 1 tablet  (50 mg total) by mouth every 8 (eight) hours as needed for moderate pain or severe pain. 02/02/15   Jess BartersJennifer Lee H Kaelob Persky, PA   BP 128/86 mmHg  Pulse 69  Temp(Src) 97.9 F (36.6 C) (Oral)  Resp 18  SpO2 98%  LMP 01/22/2015 Physical Exam  Constitutional: She is oriented to person, place, and time. She appears well-developed and well-nourished. No distress.  HENT:  Head: Normocephalic and atraumatic.  Eyes: Conjunctivae are normal.  Cardiovascular: Normal rate.   Pulmonary/Chest: Effort normal.  Musculoskeletal: Normal range of motion.       Right foot: There is tenderness, bony tenderness and swelling. There is normal range of motion, normal capillary refill, no crepitus, no deformity and no laceration.       Feet:  Outlined area is region of discomfort  Neurological: She is alert and oriented to person, place, and time.  Skin: Skin is warm and dry.  +intact  Psychiatric: She has a normal mood and affect. Her behavior is normal.  Nursing note and vitals reviewed.   ED Course  Procedures (including critical care time) Labs Review Labs Reviewed - No data to display  Imaging Review Dg Foot Complete Right  02/02/2015   CLINICAL DATA:  Right foot pain/injury  EXAM: RIGHT FOOT COMPLETE -  3+ VIEW  COMPARISON:  None.  FINDINGS: Nondisplaced fracture involving the proximal aspect of the 5th metatarsal.  The joint spaces are preserved.  The visualized soft tissues are unremarkable.  IMPRESSION: Nondisplaced fracture involving the proximal 5th metatarsal.   Electronically Signed   By: Charline Bills M.D.   On: 02/02/2015 13:40     MDM   1. Jones fracture, right, closed, initial encounter   Discussed implications of Jones fracture and increased potential for nonunion with this type of injury. Advised need for close follow up with her orthopedist or ortho on call (Dr. Eulah Pont). Patient placed in short leg posterior splint with stirrup and provided crutches and instructed to remain  non-weight bearing until follow up with ortho. Tramadol Rx for pain. Instructions regarding splint care and RICE therapy at home given to patient.    Ria Clock, [place] 02/02/15 1357

## 2015-02-02 NOTE — ED Notes (Signed)
Was standing and ironing yesterday at home and iron started to fall.  She twisted her R foot so it would not get hit.  Heard and felt a crack.  C/o pain R lat. Foot and states its swollen.

## 2015-02-02 NOTE — ED Notes (Signed)
Ortho tech here to apply posterior splint with stirrup and crutches.

## 2015-02-12 NOTE — ED Notes (Signed)
DR Denyse Amassorey discussed w patient

## 2015-02-12 NOTE — ED Notes (Signed)
Patient called w questions about her follow up appointment. disscussed w Dr Denyse Amassorey

## 2015-02-13 ENCOUNTER — Emergency Department (HOSPITAL_COMMUNITY): Payer: Self-pay

## 2015-02-13 ENCOUNTER — Emergency Department (INDEPENDENT_AMBULATORY_CARE_PROVIDER_SITE_OTHER)
Admission: EM | Admit: 2015-02-13 | Discharge: 2015-02-13 | Disposition: A | Discharge status: Home / Self Care | Payer: Self-pay | Source: Home / Self Care | Attending: Family Medicine | Admitting: Family Medicine

## 2015-02-13 ENCOUNTER — Encounter (HOSPITAL_COMMUNITY): Payer: Self-pay | Admitting: Emergency Medicine

## 2015-02-13 ENCOUNTER — Emergency Department (HOSPITAL_COMMUNITY): Admission: EM | Admit: 2015-02-13 | Discharge: 2015-02-13 | Discharge status: Absent without leave | Payer: Self-pay

## 2015-02-13 DIAGNOSIS — S92351D Displaced fracture of fifth metatarsal bone, right foot, subsequent encounter for fracture with routine healing: Secondary | ICD-10-CM

## 2015-02-13 NOTE — Discharge Instructions (Signed)
Thank you for coming in today.   Cast or Splint Care Casts and splints support injured limbs and keep bones from moving while they heal. It is important to care for your cast or splint at home.  HOME CARE INSTRUCTIONS  Keep the cast or splint uncovered during the drying period. It can take 24 to 48 hours to dry if it is made of plaster. A fiberglass cast will dry in less than 1 hour.  Do not rest the cast on anything harder than a pillow for the first 24 hours.  Do not put weight on your injured limb or apply pressure to the cast until your health care provider gives you permission.  Keep the cast or splint dry. Wet casts or splints can lose their shape and may not support the limb as well. A wet cast that has lost its shape can also create harmful pressure on your skin when it dries. Also, wet skin can become infected.  Cover the cast or splint with a plastic bag when bathing or when out in the rain or snow. If the cast is on the trunk of the body, take sponge baths until the cast is removed.  If your cast does become wet, dry it with a towel or a blow dryer on the cool setting only.  Keep your cast or splint clean. Soiled casts may be wiped with a moistened cloth.  Do not place any hard or soft foreign objects under your cast or splint, such as cotton, toilet paper, lotion, or powder.  Do not try to scratch the skin under the cast with any object. The object could get stuck inside the cast. Also, scratching could lead to an infection. If itching is a problem, use a blow dryer on a cool setting to relieve discomfort.  Do not trim or cut your cast or remove padding from inside of it.  Exercise all joints next to the injury that are not immobilized by the cast or splint. For example, if you have a long leg cast, exercise the hip joint and toes. If you have an arm cast or splint, exercise the shoulder, elbow, thumb, and fingers.  Elevate your injured arm or leg on 1 or 2 pillows for the  first 1 to 3 days to decrease swelling and pain.It is best if you can comfortably elevate your cast so it is higher than your heart. SEEK MEDICAL CARE IF:   Your cast or splint cracks.  Your cast or splint is too tight or too loose.  You have unbearable itching inside the cast.  Your cast becomes wet or develops a soft spot or area.  You have a bad smell coming from inside your cast.  You get an object stuck under your cast.  Your skin around the cast becomes red or raw.  You have new pain or worsening pain after the cast has been applied. SEEK IMMEDIATE MEDICAL CARE IF:   You have fluid leaking through the cast.  You are unable to move your fingers or toes.  You have discolored (blue or white), cool, painful, or very swollen fingers or toes beyond the cast.  You have tingling or numbness around the injured area.  You have severe pain or pressure under the cast.  You have any difficulty with your breathing or have shortness of breath.  You have chest pain. Document Released: 10/29/2000 Document Revised: 08/22/2013 Document Reviewed: 05/10/2013 [facility] Patient Information 2015 [place], [place]. This information is not intended to replace  advice given to you by your health care provider. Make sure you discuss any questions you have with your health care provider.

## 2015-02-13 NOTE — ED Notes (Signed)
Pt here for follow up and x rays.  Voices no new concerns at this time.

## 2015-02-13 NOTE — ED Provider Notes (Signed)
Veronica Contreras is a 39 y.o. female who presents to Urgent Care today for follow up right foot. Patient was seen on March 20 and diagnosed with a Jones fracture. She was treated with posterior leg splint stirrups and nonweightbearing. Unfortunately she does not have health insurance and has been unable to follow-up with orthopedics. We had a long conversation yesterday over the phone. Patient has return to clinic. She notes itching along the skin and continued moderate pain along the lateral aspect of her foot. No radiating pain or numbness.   Past Medical History  Diagnosis Date  . Acid reflux   . Ectopic pregnancy    Past Surgical History  Procedure Laterality Date  . Laparoscopy for ectopic pregnancy      Salpingectomy  . Salpingectomy      R/T ectopic pregnancy   History  Substance Use Topics  . Smoking status: Former Smoker    Quit date: 05/28/2012  . Smokeless tobacco: Never Used  . Alcohol Use: No   ROS as above Medications: No current facility-administered medications for this encounter.   Current Outpatient Prescriptions  Medication Sig Dispense Refill  . doxycycline (DORYX) 150 MG EC tablet     . ibuprofen (ADVIL,MOTRIN) 200 MG tablet Take 400 mg by mouth every 8 (eight) hours as needed. For pain.    Marland Kitchen. PANTOPRAZOLE SODIUM PO Take by mouth.    . traMADol (ULTRAM) 50 MG tablet Take 1 tablet (50 mg total) by mouth every 8 (eight) hours as needed for moderate pain or severe pain. 20 tablet 0   Allergies  Allergen Reactions  . Vicodin [Hydrocodone-Acetaminophen] Nausea And Vomiting     Exam:  BP 139/74 mmHg  Pulse 90  Temp(Src) 97.4 F (36.3 C) (Oral)  Resp 12  SpO2 100%  LMP 01/22/2015 Gen: Well NAD Right leg: Skin is well-appearing. No maceration. Swelling is significantly improved. Tender to palpation right lateral fifth metatarsal   A short leg cast was applied.  No results found for this or any previous visit (from the past 24 hour(s)). No results  found.  Assessment and Plan: 39 y.o. female with Jones fracture. [facility]. Return as needed.  Discussed warning signs or symptoms. Please see discharge instructions. Patient expresses understanding.     Rodolph BongEvan S Bani Gianfrancesco, MD 02/13/15 [phone]

## 2015-02-25 DIAGNOSIS — S92301A Fracture of unspecified metatarsal bone(s), right foot, initial encounter for closed fracture: Secondary | ICD-10-CM | POA: Insufficient documentation

## 2015-02-25 DIAGNOSIS — S99191A Other physeal fracture of right metatarsal, initial encounter for closed fracture: Secondary | ICD-10-CM | POA: Insufficient documentation

## 2015-04-22 IMAGING — US US ABDOMEN COMPLETE
1 series · 13 of 25 positions shown · non-contrast
Comparison: CT abdomen pelvis of 06/21/2010

CLINICAL DATA: Symptoms of esophageal reflux

COMPLETE ABDOMINAL ULTRASOUND

[Series 1: us abdomen complete · 0.24mm/px · 13 of 83 slices shown]
[im 1/83]
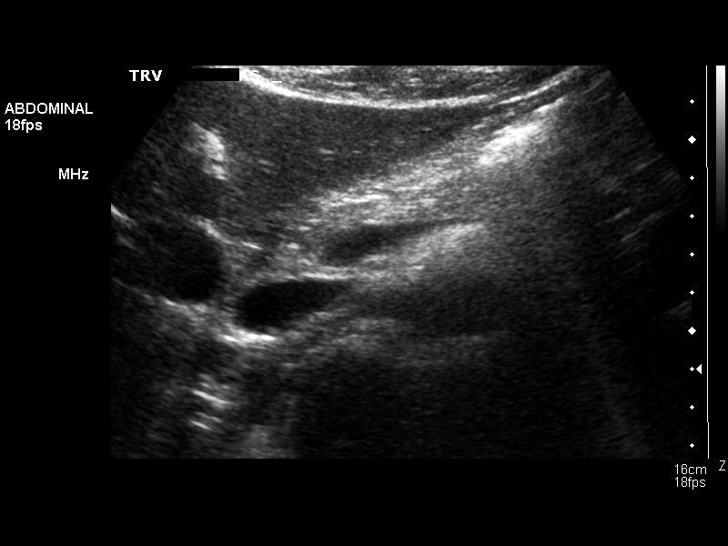
[im 7/83]
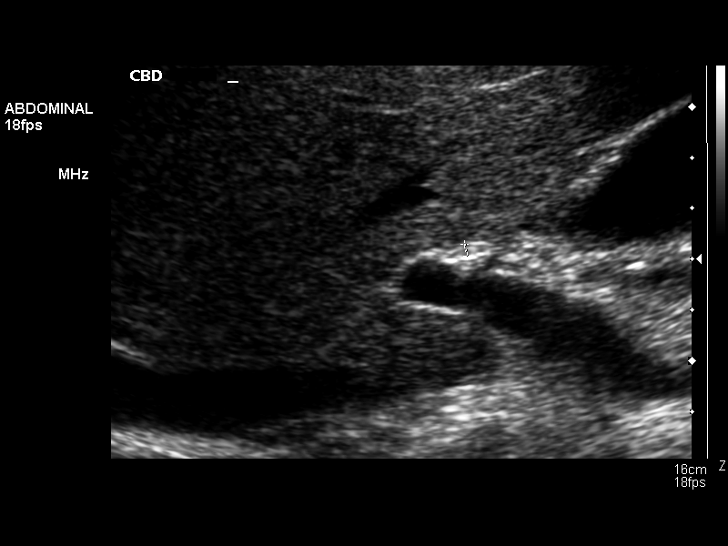
[im 14/83]
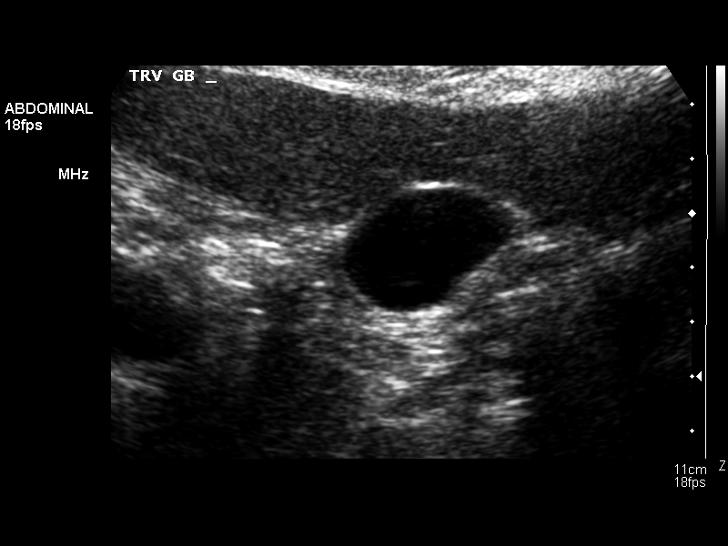
[im 21/83]
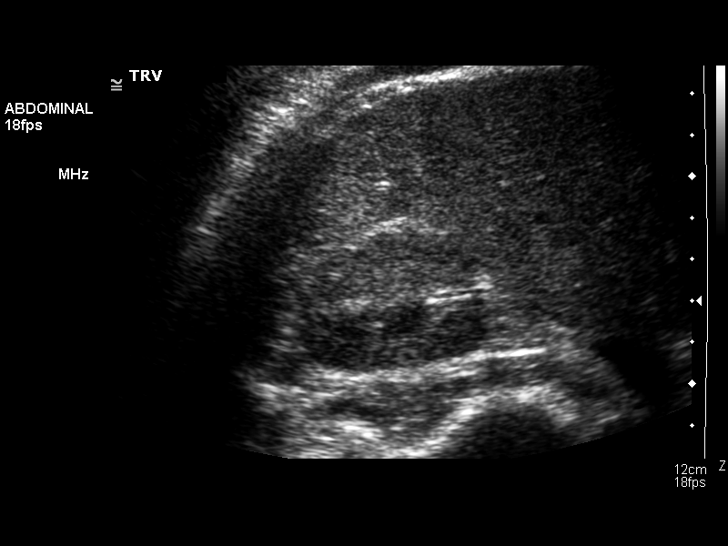
[im 28/83]
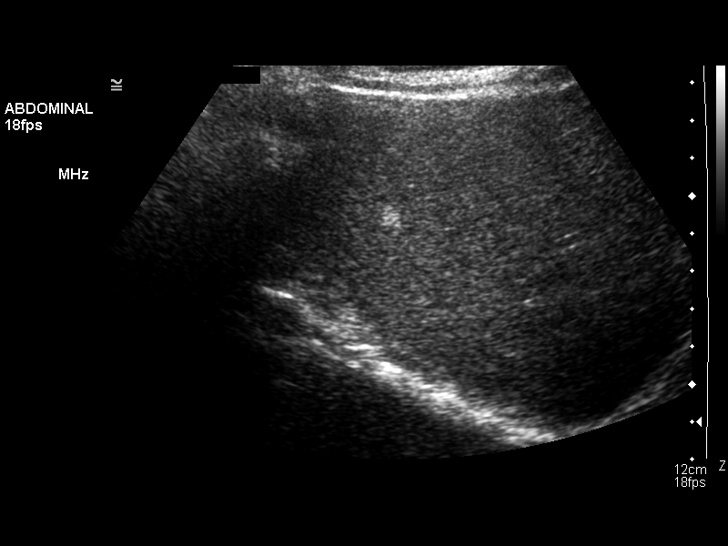
[im 35/83]
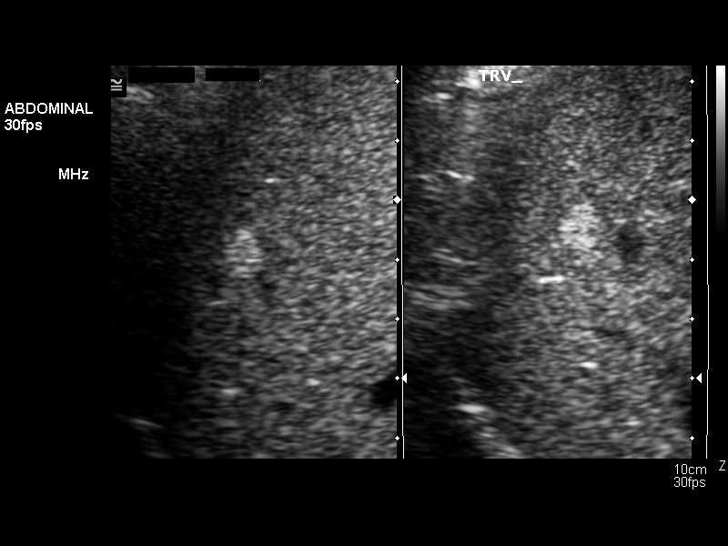
[im 42/83]
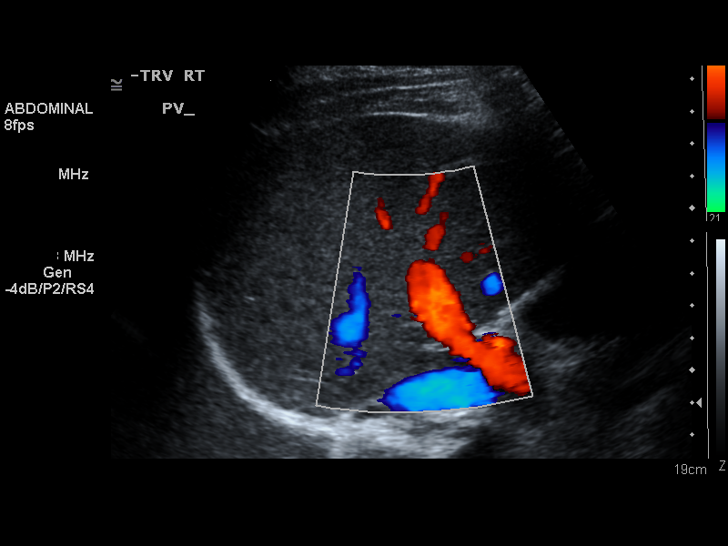
[im 48/83]
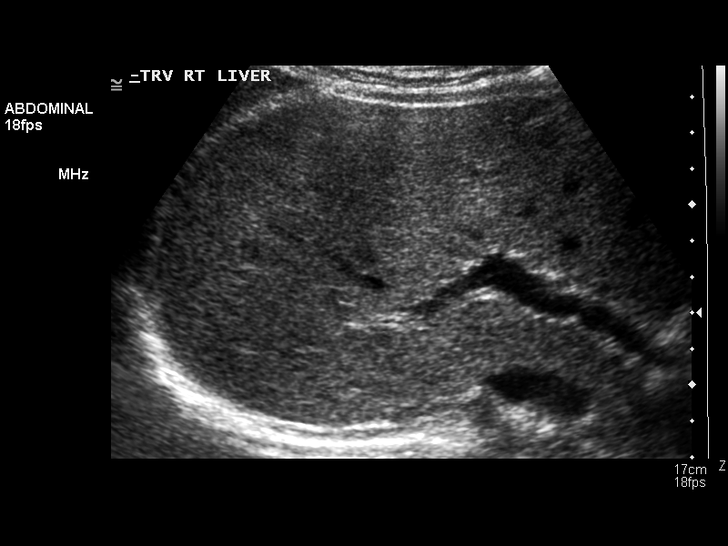
[im 55/83]
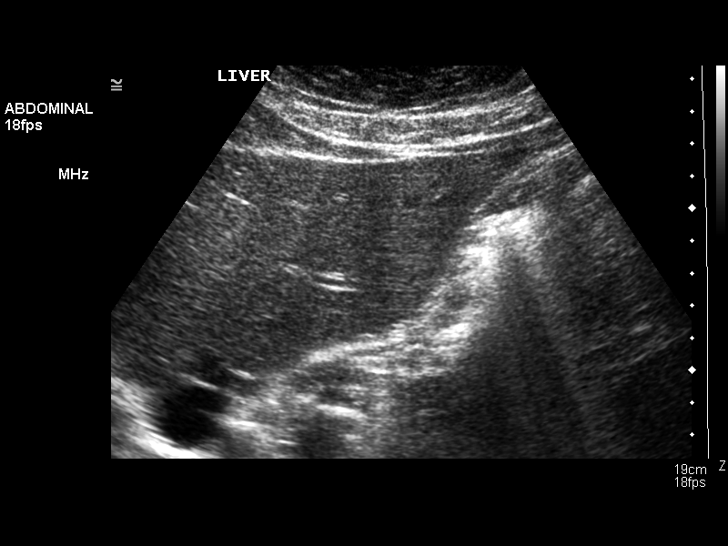
[im 62/83]
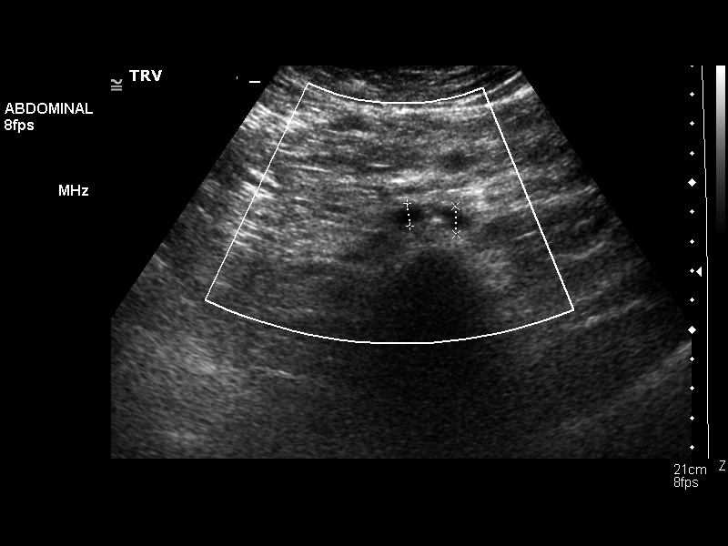
[im 69/83]
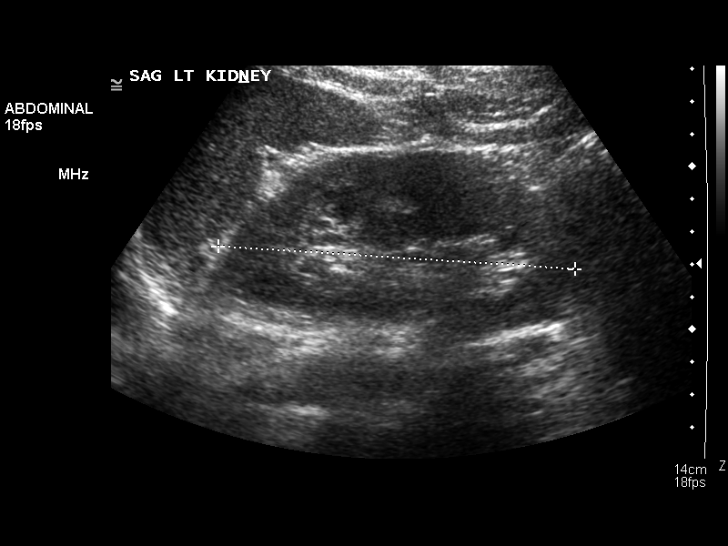
[im 76/83]
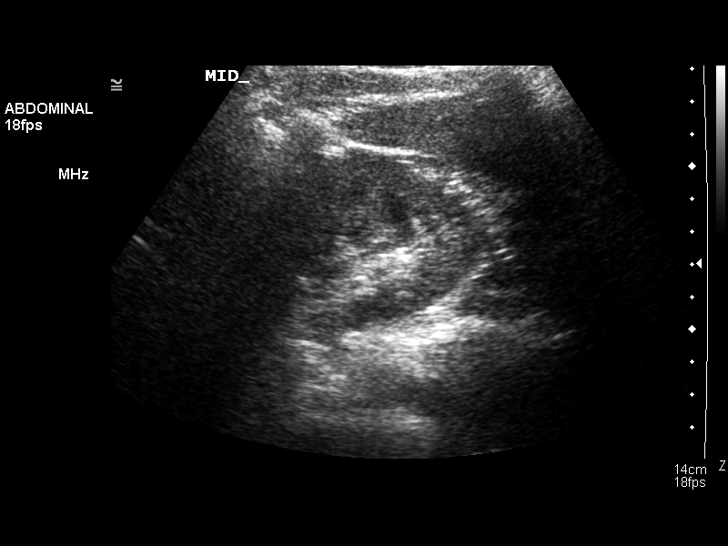
[im 83/83]
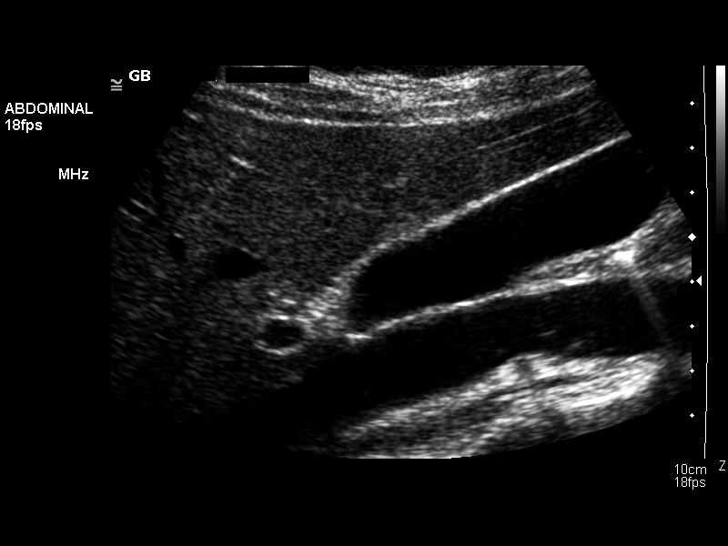

[13 of 25 positions shown; findings below may reference images not displayed]

FINDINGS: Gallbladder:  The gallbladder is well visualized and no gallstones
are noted.  There is no pain over the gallbladder with compression.

Common bile duct:  The common bile duct is normal measuring 3.1 mm
in diameter distally.

Liver:  The liver is somewhat inhomogeneous suggesting mild fatty
infiltration.  There is a single echogenic focus in the right lobe
of 9 mm in diameter consistent with a small hemangioma of doubtful
clinical significance.

IVC:  Appears normal.

Pancreas:  Portions of the pancreas are obscured by bowel gas.

Spleen:  The spleen is normal measuring 6.1 cm sagittally.

Right Kidney:  No hydronephrosis is seen.  The right kidney
measures 11.4 cm sagittally.

Left Kidney:  No hydronephrosis is noted.  The left kidney measures
11.0 cm.

Abdominal aorta:  The abdominal aorta is normal in caliber.
IMPRESSION: 1.  No gallstones.  No ductal dilatation.
2.  Mild inhomogeneity of the liver may represent mild fatty
infiltration.  Correlate with liver function tests.
3.  9 mm echogenic focus in the right lobe of liver consistent with
an incidental hemangioma.
4.  The pancreas is obscured by bowel gas.

## 2016-02-03 ENCOUNTER — Other Ambulatory Visit: Payer: Self-pay | Admitting: Internal Medicine

## 2016-02-03 ENCOUNTER — Ambulatory Visit
Admission: RE | Admit: 2016-02-03 | Discharge: 2016-02-03 | Disposition: A | Discharge status: Home / Self Care | Payer: 59 | Source: Ambulatory Visit | Attending: Internal Medicine | Admitting: Internal Medicine

## 2016-02-03 DIAGNOSIS — M545 Low back pain: Secondary | ICD-10-CM

## 2016-05-11 ENCOUNTER — Ambulatory Visit: Payer: 59 | Admitting: Podiatry

## 2016-06-01 ENCOUNTER — Encounter: Payer: Self-pay | Admitting: Podiatry

## 2016-06-01 ENCOUNTER — Ambulatory Visit (INDEPENDENT_AMBULATORY_CARE_PROVIDER_SITE_OTHER): Payer: 59 | Admitting: Podiatry

## 2016-06-01 VITALS — BP 100/64 | HR 86 | Resp 12

## 2016-06-01 DIAGNOSIS — L603 Nail dystrophy: Secondary | ICD-10-CM

## 2016-06-01 NOTE — Progress Notes (Signed)
   Subjective:    Patient ID: Veronica Contreras, female    DOB: 25-Jun-1976, 40 y.o.   MRN: [id number]  HPI: She presents today with a 2 month history of pain to the second digit left foot. She is concerned about the thickening of her nail and azygous thicker becomes more painful. She also concerned about the discoloration of the nail plate. She denies any trauma.    Review of Systems  Skin: Positive for color change.       Objective:   Physical Exam: Vital signs are stable she is alert and oriented 3. Pulses are palpable. Neurologic sensorium is intact. Degenerative flexor intact. Muscle strength is normal bilateral. Orthopedic evaluation as her tall joints distal to the ankle for range of motion without crepitation. Cutaneous evaluation shows supple well-hydrated cutis thick dystrophic nail second digit left with all of the other nail plates appearing perfectly normal. There is discoloration there is no purulence and no malodor and minimal tenderness on palpation or debridement.        Assessment & Plan:  Assessment: Nail dystrophy secondary to left foot.  Plan: To assemble a skin and nail today to be sent for pathologic evaluation will follow up with her in 1 month.

## 2016-06-29 ENCOUNTER — Ambulatory Visit (INDEPENDENT_AMBULATORY_CARE_PROVIDER_SITE_OTHER): Payer: 59 | Admitting: Podiatry

## 2016-06-29 ENCOUNTER — Encounter: Payer: Self-pay | Admitting: Podiatry

## 2016-06-29 DIAGNOSIS — L603 Nail dystrophy: Secondary | ICD-10-CM

## 2016-06-29 DIAGNOSIS — Z79899 Other long term (current) drug therapy: Secondary | ICD-10-CM | POA: Diagnosis not present

## 2016-06-30 NOTE — Progress Notes (Signed)
She presents today for follow-up of her negative fungus report.  Objective: Nail dystrophy negative fungus report.  Plan: Follow up with us as needed we discussed options today for nail removal she declined.

## 2017-11-01 ENCOUNTER — Ambulatory Visit: Payer: 59 | Admitting: Podiatry

## 2017-11-01 ENCOUNTER — Encounter: Payer: Self-pay | Admitting: Podiatry

## 2017-11-01 DIAGNOSIS — L603 Nail dystrophy: Secondary | ICD-10-CM

## 2017-11-01 NOTE — Addendum Note (Signed)
Addended by: Lottie RaterPREVETTE, Disa Riedlinger E on: 11/01/2017 04:56 PM   Modules accepted: Orders

## 2017-11-01 NOTE — Progress Notes (Signed)
She presents today after having seen me for nail dystrophy before.  She states that it seems to be in the fingernails and another October 2 that appears to be different.  She is referring to the hallux left and the fifth digit of the left foot.  Objective: Vital signs are stable she is alert and oriented x3.  Toenails do demonstrate change in thickening with distal onycholysis.  She also has a thumb nail that appears to be fungal as well.  Assessment: Samples of the toenails and skin were taken today for pathologic evaluation once this comes back positive we will get her started on oral medication.

## 2017-11-17 ENCOUNTER — Telehealth: Payer: Self-pay | Admitting: Podiatry

## 2017-11-17 NOTE — Telephone Encounter (Signed)
Dr. Al CorpusHyatt did pathology tests on my nails for fungus. I went to see another doctor who wants to compare the nails so she needs my pathology results from Dr. Al CorpusHyatt. Her name is Dr. Lucy AntiguaKristen Snider and her fax number is [phone]. Thank you.

## 2017-11-22 ENCOUNTER — Telehealth: Payer: Self-pay | Admitting: Podiatry

## 2017-11-22 NOTE — Telephone Encounter (Signed)
Attempted to call pt back from message left on 17 November 2017. I left her a voicemail that I was returning her call in regards to the message she left requesting her pathology report be faxed to Dr. Lucy AntiguaKristen Snider. I told her she would need to fill out and sign a medical records release form before we could release those records. I told her we could fax it, e-mail it, or she could come by our office to complete the form. Told pt to call me back directly with any other questions at [phone] and to leave a message if I do not answer.

## 2017-11-22 NOTE — Telephone Encounter (Signed)
I'm returning your call in regards to having my labs sent to my dermatologist. Can you e-mail me the release form and include your fax number so that I can fax it back to you. Pt requested the form be e-mailed to her at [email or web address]. I told her I would get the form e-mailed to her and make sure that I included the fax number to fax it back to me at.

## 2017-11-29 ENCOUNTER — Ambulatory Visit: Payer: 59 | Admitting: Podiatry

## 2017-12-06 ENCOUNTER — Ambulatory Visit: Payer: 59 | Admitting: Podiatry

## 2017-12-13 ENCOUNTER — Other Ambulatory Visit: Payer: Self-pay | Admitting: Internal Medicine

## 2017-12-13 DIAGNOSIS — E049 Nontoxic goiter, unspecified: Secondary | ICD-10-CM

## 2017-12-21 ENCOUNTER — Ambulatory Visit
Admission: RE | Admit: 2017-12-21 | Discharge: 2017-12-21 | Disposition: A | Discharge status: Home / Self Care | Payer: 59 | Source: Ambulatory Visit | Attending: Internal Medicine | Admitting: Internal Medicine

## 2017-12-21 ENCOUNTER — Other Ambulatory Visit: Payer: Self-pay | Admitting: Internal Medicine

## 2017-12-21 DIAGNOSIS — E049 Nontoxic goiter, unspecified: Secondary | ICD-10-CM

## 2017-12-21 DIAGNOSIS — N6310 Unspecified lump in the right breast, unspecified quadrant: Secondary | ICD-10-CM

## 2017-12-30 ENCOUNTER — Ambulatory Visit: Payer: 59

## 2017-12-30 ENCOUNTER — Ambulatory Visit
Admission: RE | Admit: 2017-12-30 | Discharge: 2017-12-30 | Disposition: A | Discharge status: Home / Self Care | Payer: 59 | Source: Ambulatory Visit | Attending: Internal Medicine | Admitting: Internal Medicine

## 2017-12-30 DIAGNOSIS — N6310 Unspecified lump in the right breast, unspecified quadrant: Secondary | ICD-10-CM

## 2018-01-12 ENCOUNTER — Other Ambulatory Visit: Payer: Self-pay | Admitting: Internal Medicine

## 2018-01-12 DIAGNOSIS — E041 Nontoxic single thyroid nodule: Secondary | ICD-10-CM

## 2018-02-02 ENCOUNTER — Ambulatory Visit
Admission: RE | Admit: 2018-02-02 | Discharge: 2018-02-02 | Disposition: A | Discharge status: Home / Self Care | Payer: 59 | Source: Ambulatory Visit | Attending: Internal Medicine | Admitting: Internal Medicine

## 2018-02-02 ENCOUNTER — Other Ambulatory Visit (HOSPITAL_COMMUNITY)
Admission: RE | Admit: 2018-02-02 | Discharge: 2018-02-02 | Disposition: A | Discharge status: Home / Self Care | Payer: 59 | Source: Ambulatory Visit | Attending: Radiology | Admitting: Radiology

## 2018-02-02 DIAGNOSIS — E041 Nontoxic single thyroid nodule: Secondary | ICD-10-CM | POA: Diagnosis not present

## 2018-02-20 ENCOUNTER — Ambulatory Visit (HOSPITAL_COMMUNITY)
Admission: EM | Admit: 2018-02-20 | Discharge: 2018-02-20 | Disposition: A | Discharge status: Home / Self Care | Payer: 59 | Attending: Family Medicine | Admitting: Family Medicine

## 2018-02-20 ENCOUNTER — Encounter (HOSPITAL_COMMUNITY): Payer: Self-pay | Admitting: Family Medicine

## 2018-02-20 DIAGNOSIS — Z3202 Encounter for pregnancy test, result negative: Secondary | ICD-10-CM | POA: Diagnosis not present

## 2018-02-20 DIAGNOSIS — N390 Urinary tract infection, site not specified: Secondary | ICD-10-CM

## 2018-02-20 LAB — POCT URINALYSIS DIP (DEVICE)
Bilirubin Urine: NEGATIVE
Glucose, UA: NEGATIVE mg/dL
Ketones, ur: NEGATIVE mg/dL
Nitrite: NEGATIVE
Protein, ur: 100 mg/dL — AB
Specific Gravity, Urine: 1.01 (ref 1.005–1.030)
Urobilinogen, UA: 0.2 mg/dL (ref 0.0–1.0)
pH: 7 (ref 5.0–8.0)

## 2018-02-20 LAB — POCT PREGNANCY, URINE: Preg Test, Ur: NEGATIVE

## 2018-02-20 MED ORDER — CEPHALEXIN 500 MG PO CAPS: 500.0000 mg | ORAL_CAPSULE | Freq: Two times a day (BID) | ORAL | 0 refills | Status: AC

## 2018-02-20 NOTE — ED Provider Notes (Signed)
[facility]    CSN: [id number] Arrival date & time: 02/20/18  1324     History   Chief Complaint Chief Complaint  Patient presents with  . Hematuria    HPI Veronica Contreras is a 42 y.o. female.   42 yo female here for urinary frequency x 2 hours and blood in her urine. She says it feels like previous UTI symptoms. Denies fever or back pain.      Past Medical History:  Diagnosis Date  . Acid reflux   . Ectopic pregnancy     Patient Active Problem List   Diagnosis Date Noted  . Closed fracture of base of fifth metatarsal bone of right foot at metaphyseal-diaphyseal junction 02/25/2015  . Fracture of metatarsal of right foot, closed 02/25/2015  . GOITER, UNSPECIFIED 12/13/2007  . DENTAL PAIN 10/02/2007  . ECTOPIC PREGNANCY 09/16/2007    Past Surgical History:  Procedure Laterality Date  . LAPAROSCOPY FOR ECTOPIC PREGNANCY     Salpingectomy  . SALPINGECTOMY     R/T ectopic pregnancy    OB History    Gravida  1   Para      Term      Preterm      AB  1   Living  0     SAB      TAB      Ectopic  1   Multiple      Live Births               Home Medications    Prior to Admission medications   Medication Sig Start Date End Date Taking? Authorizing Provider  Vitamin D, Ergocalciferol, (DRISDOL) 50000 units CAPS capsule Take 50,000 Units by mouth every 7 (seven) days.   Yes [provider]    Family History Family History  Problem Relation Age of Onset  . Hypertension Mother   . Cancer Maternal Aunt        breast  . Breast cancer Maternal Aunt     Social History Social History   Tobacco Use  . Smoking status: Former Smoker    Last attempt to quit: 05/28/2012    Years since quitting: 5.7  . Smokeless tobacco: Never Used  Substance Use Topics  . Alcohol use: No  . Drug use: No    Frequency: 1.0 times per week    Types: Marijuana    Comment: none since 09/2014     Allergies   Vicodin  [hydrocodone-acetaminophen]   Review of Systems Review of Systems  Constitutional: Negative for activity change and appetite change.  HENT: Negative for congestion and ear discharge.   Eyes: Negative for discharge and itching.  Respiratory: Negative for apnea and chest tightness.   Cardiovascular: Negative for chest pain and leg swelling.  Gastrointestinal: Negative for abdominal distention and abdominal pain.  Endocrine: Negative for cold intolerance and heat intolerance.  Genitourinary: Positive for dysuria and frequency.  Musculoskeletal: Negative for arthralgias and back pain.  Neurological: Negative for dizziness and headaches.  Hematological: Negative for adenopathy. Does not bruise/bleed easily.     Physical Exam Triage Vital Signs ED Triage Vitals  Enc Vitals Group     BP      Pulse      Resp      Temp      Temp src      SpO2      Weight      Height      Head Circumference  Peak Flow      Pain Score      Pain Loc      Pain Edu?      Excl. in GC?    No data found.  Updated Vital Signs BP 131/68   Pulse 73   Resp 18   LMP 02/05/2018   SpO2 98%   Visual Acuity Right Eye Distance:   Left Eye Distance:   Bilateral Distance:    Right Eye Near:   Left Eye Near:    Bilateral Near:     Physical Exam  Constitutional: She is oriented to person, place, and time. She appears well-developed and well-nourished.  HENT:  Head: Normocephalic and atraumatic.  Eyes: Pupils are equal, round, and reactive to light. EOM are normal.  Neck: Normal range of motion. Neck supple.  Cardiovascular: Normal rate and intact distal pulses.  Pulmonary/Chest: Effort normal. No respiratory distress.  Abdominal: Soft. She exhibits no distension.  Musculoskeletal: Normal range of motion. She exhibits no edema.  Neurological: She is alert and oriented to person, place, and time.  Skin: Skin is warm and dry.  Psychiatric: She has a normal mood and affect. Her behavior is  normal.     UC Treatments / Results  Labs (all labs ordered are listed, but only abnormal results are displayed) Labs Reviewed  POCT URINALYSIS DIP (DEVICE) - Abnormal; Notable for the following components:      Result Value   Hgb urine dipstick LARGE (*)    Protein, ur 100 (*)    Leukocytes, UA SMALL (*)    All other components within normal limits  POCT PREGNANCY, URINE    EKG None Radiology No results found.  Procedures Procedures (including critical care time)  Medications Ordered in UC Medications - No data to display   Initial Impression / Assessment and Plan / UC Course  I have reviewed the triage vital signs and the nursing notes.  Pertinent labs & imaging results that were available during my care of the patient were reviewed by me and considered in my medical decision making (see chart for details).     1. UTI- treat with keflex. Follow up with PCP.  Final Clinical Impressions(s) / UC Diagnoses   Final diagnoses:  None    ED Discharge Orders    None       Controlled Substance Prescriptions  Controlled Substance Registry consulted? Not Applicable   Rolm BookbinderMoss, Logann Whitebread, DO 02/20/18 1919

## 2018-02-20 NOTE — ED Triage Notes (Signed)
Pt here for hematuria and lower abd pressure.

## 2018-09-13 ENCOUNTER — Emergency Department (HOSPITAL_COMMUNITY): Payer: 59

## 2018-09-13 ENCOUNTER — Emergency Department (HOSPITAL_COMMUNITY)
Admission: EM | Admit: 2018-09-13 | Discharge: 2018-09-13 | Disposition: A | Discharge status: Home / Self Care | Payer: 59 | Attending: Emergency Medicine | Admitting: Emergency Medicine

## 2018-09-13 ENCOUNTER — Encounter (HOSPITAL_COMMUNITY): Payer: Self-pay

## 2018-09-13 ENCOUNTER — Other Ambulatory Visit: Payer: Self-pay

## 2018-09-13 DIAGNOSIS — R0982 Postnasal drip: Secondary | ICD-10-CM | POA: Diagnosis not present

## 2018-09-13 DIAGNOSIS — R0981 Nasal congestion: Secondary | ICD-10-CM | POA: Diagnosis not present

## 2018-09-13 DIAGNOSIS — Z79899 Other long term (current) drug therapy: Secondary | ICD-10-CM | POA: Diagnosis not present

## 2018-09-13 DIAGNOSIS — R042 Hemoptysis: Secondary | ICD-10-CM

## 2018-09-13 DIAGNOSIS — Z87891 Personal history of nicotine dependence: Secondary | ICD-10-CM | POA: Diagnosis not present

## 2018-09-13 MED ORDER — BENZONATATE 100 MG PO CAPS: 200.0000 mg | ORAL_CAPSULE | Freq: Two times a day (BID) | ORAL | 0 refills | Status: DC | PRN

## 2018-09-13 MED ORDER — CETIRIZINE HCL 10 MG PO TABS: 10.0000 mg | ORAL_TABLET | Freq: Every day | ORAL | 0 refills | Status: DC

## 2018-09-13 NOTE — Discharge Instructions (Addendum)
Return for fever, chest pain, or shortness of breath.

## 2018-09-13 NOTE — ED Notes (Signed)
Patient transported to X-ray 

## 2018-09-13 NOTE — ED Triage Notes (Signed)
Pt reports that she has been recovering from a URI for the last week. Today she states that she coughed up some bright red blood. A&Ox4. No pain.

## 2018-09-13 NOTE — ED Provider Notes (Signed)
[facility]-EMERGENCY DEPT Provider Note   CSN: [id number] Arrival date & time: 09/13/18  2141     History   Chief Complaint Chief Complaint  Patient presents with  . Hemoptysis    HPI Veronica Contreras is a 42 y.o. female.  Patient presents to the emergency department with a chief complaint of cough.  She reports having had cough for the past 4 to 5 days.  She states that today she noticed some hemoptysis.  She reports coughing up a couple of episodes of blood-tinged mucus.  She denies any fevers or chills.  Denies shortness of breath or chest pain.  No reported history of PE or DVT.  She does report persistent postnasal drip and sinus congestion.  The history is provided by the patient. No language interpreter was used.    Past Medical History:  Diagnosis Date  . Acid reflux   . Ectopic pregnancy     Patient Active Problem List   Diagnosis Date Noted  . Closed fracture of base of fifth metatarsal bone of right foot at metaphyseal-diaphyseal junction 02/25/2015  . Fracture of metatarsal of right foot, closed 02/25/2015  . GOITER, UNSPECIFIED 12/13/2007  . DENTAL PAIN 10/02/2007  . ECTOPIC PREGNANCY 09/16/2007    Past Surgical History:  Procedure Laterality Date  . LAPAROSCOPY FOR ECTOPIC PREGNANCY     Salpingectomy  . SALPINGECTOMY     R/T ectopic pregnancy     OB History    Gravida  1   Para      Term      Preterm      AB  1   Living  0     SAB      TAB      Ectopic  1   Multiple      Live Births               Home Medications    Prior to Admission medications   Medication Sig Start Date End Date Taking? Authorizing Provider  benzonatate (TESSALON) 100 MG capsule Take 2 capsules (200 mg total) by mouth 2 (two) times daily as needed for cough. 09/13/18   Roxy Horseman, PA-C  cetirizine (ZYRTEC ALLERGY) 10 MG tablet Take 1 tablet (10 mg total) by mouth daily. 09/13/18   Roxy Horseman, PA-C  Vitamin D,  Ergocalciferol, (DRISDOL) 50000 units CAPS capsule Take 50,000 Units by mouth every 7 (seven) days.    [provider]    Family History Family History  Problem Relation Age of Onset  . Hypertension Mother   . Cancer Maternal Aunt        breast  . Breast cancer Maternal Aunt     Social History Social History   Tobacco Use  . Smoking status: Former Smoker    Last attempt to quit: 05/28/2012    Years since quitting: 6.2  . Smokeless tobacco: Never Used  Substance Use Topics  . Alcohol use: No  . Drug use: No    Frequency: 1.0 times per week    Types: Marijuana    Comment: none since 09/2014     Allergies   Vicodin [hydrocodone-acetaminophen]   Review of Systems Review of Systems  All other systems reviewed and are negative.    Physical Exam Updated Vital Signs BP 136/87 (BP Location: Left Arm)   Pulse 77   Temp 98.3 F (36.8 C) (Oral)   Resp 18   LMP 08/19/2018   SpO2 95%   Physical Exam  Physical Exam  Constitutional: Pt  is oriented to person, place, and time. Appears well-developed and well-nourished. No distress.  HENT:  Head: Normocephalic and atraumatic.  Right Ear: Tympanic membrane, external ear and ear canal normal.  Left Ear: Tympanic membrane, external ear and ear canal normal.  Nose: Mucosal edema and moderate rhinorrhea present. No epistaxis. Right sinus exhibits no maxillary sinus tenderness and no frontal sinus tenderness. Left sinus exhibits no maxillary sinus tenderness and no frontal sinus tenderness.  Mouth/Throat: Uvula is midline and mucous membranes are normal. Mucous membranes are not pale and not cyanotic. No oropharyngeal exudate, posterior oropharyngeal edema, posterior oropharyngeal erythema or tonsillar abscesses.  Eyes: Conjunctivae are normal. Pupils are equal, round, and reactive to light.  Neck: Normal range of motion and full passive range of motion without pain.  Cardiovascular: Normal rate and intact distal pulses.     Pulmonary/Chest: Effort normal and breath sounds normal. No stridor.  Clear and equal breath sounds without focal wheezes, rhonchi, rales  Abdominal: Soft. Bowel sounds are normal. There is no tenderness.  Musculoskeletal: Normal range of motion.  Lymphadenopathy:    Pthas no cervical adenopathy.  Neurological: Pt is alert and oriented to person, place, and time.  Skin: Skin is warm and dry. No rash noted. Pt is not diaphoretic.  Psychiatric: Normal mood and affect.  Nursing note and vitals reviewed.    ED Treatments / Results  Labs (all labs ordered are listed, but only abnormal results are displayed) Labs Reviewed - No data to display  EKG None  Radiology Dg Chest 2 View  Result Date: 09/13/2018 CLINICAL DATA:  Cold since 09/12/2018 with cough and hemoptysis today. EXAM: CHEST - 2 VIEW COMPARISON:  11/30/2005 FINDINGS: The heart size and mediastinal contours are within normal limits. Both lungs are clear. The visualized skeletal structures are unremarkable. IMPRESSION: No active cardiopulmonary disease. Electronically Signed   By: Tollie Eth M.D.   On: 09/13/2018 22:47    Procedures Procedures (including critical care time)  Medications Ordered in ED Medications - No data to display   Initial Impression / Assessment and Plan / ED Course  I have reviewed the triage vital signs and the nursing notes.  Pertinent labs & imaging results that were available during my care of the patient were reviewed by me and considered in my medical decision making (see chart for details).     Pt CXR negative for acute infiltrate. Patients symptoms are consistent with URI, likely viral etiology. Discussed that antibiotics are not indicated for viral infections. Pt will be discharged with symptomatic treatment.  Verbalizes understanding and is agreeable with plan. Pt is hemodynamically stable & in NAD prior to dc.   Final Clinical Impressions(s) / ED Diagnoses   Final diagnoses:   Hemoptysis    ED Discharge Orders         Ordered    benzonatate (TESSALON) 100 MG capsule  2 times daily PRN     09/13/18 2322    cetirizine (ZYRTEC ALLERGY) 10 MG tablet  Daily     09/13/18 2322           Roxy Horseman, PA-C 09/13/18 2324    Molpus, Jonny Ruiz, MD 09/14/18 [phone]

## 2018-11-15 DIAGNOSIS — L4050 Arthropathic psoriasis, unspecified: Secondary | ICD-10-CM

## 2018-11-15 HISTORY — DX: Arthropathic psoriasis, unspecified: L40.50

## 2019-01-04 ENCOUNTER — Encounter: Payer: 59 | Admitting: Internal Medicine

## 2019-04-30 DIAGNOSIS — L400 Psoriasis vulgaris: Secondary | ICD-10-CM | POA: Diagnosis not present

## 2019-07-18 DIAGNOSIS — E049 Nontoxic goiter, unspecified: Secondary | ICD-10-CM | POA: Diagnosis not present

## 2019-07-18 DIAGNOSIS — E663 Overweight: Secondary | ICD-10-CM | POA: Diagnosis not present

## 2019-07-18 DIAGNOSIS — M19041 Primary osteoarthritis, right hand: Secondary | ICD-10-CM | POA: Diagnosis not present

## 2019-07-19 ENCOUNTER — Other Ambulatory Visit: Payer: Self-pay | Admitting: Family Medicine

## 2019-07-19 DIAGNOSIS — E049 Nontoxic goiter, unspecified: Secondary | ICD-10-CM

## 2019-07-20 DIAGNOSIS — M19041 Primary osteoarthritis, right hand: Secondary | ICD-10-CM | POA: Diagnosis not present

## 2019-07-20 DIAGNOSIS — E049 Nontoxic goiter, unspecified: Secondary | ICD-10-CM | POA: Diagnosis not present

## 2019-07-20 DIAGNOSIS — E663 Overweight: Secondary | ICD-10-CM | POA: Diagnosis not present

## 2019-07-25 ENCOUNTER — Ambulatory Visit
Admission: RE | Admit: 2019-07-25 | Discharge: 2019-07-25 | Disposition: A | Discharge status: Home / Self Care | Payer: 59 | Source: Ambulatory Visit | Attending: Family Medicine | Admitting: Family Medicine

## 2019-07-25 DIAGNOSIS — E041 Nontoxic single thyroid nodule: Secondary | ICD-10-CM | POA: Diagnosis not present

## 2019-07-25 DIAGNOSIS — E049 Nontoxic goiter, unspecified: Secondary | ICD-10-CM

## 2019-07-31 DIAGNOSIS — L400 Psoriasis vulgaris: Secondary | ICD-10-CM | POA: Diagnosis not present

## 2019-08-01 DIAGNOSIS — E049 Nontoxic goiter, unspecified: Secondary | ICD-10-CM | POA: Diagnosis not present

## 2019-08-01 DIAGNOSIS — E663 Overweight: Secondary | ICD-10-CM | POA: Diagnosis not present

## 2019-08-01 DIAGNOSIS — M19041 Primary osteoarthritis, right hand: Secondary | ICD-10-CM | POA: Diagnosis not present

## 2019-08-14 DIAGNOSIS — L400 Psoriasis vulgaris: Secondary | ICD-10-CM | POA: Diagnosis not present

## 2019-09-09 DIAGNOSIS — Z111 Encounter for screening for respiratory tuberculosis: Secondary | ICD-10-CM | POA: Diagnosis not present

## 2019-09-09 DIAGNOSIS — Z23 Encounter for immunization: Secondary | ICD-10-CM | POA: Diagnosis not present

## 2019-09-11 DIAGNOSIS — Z111 Encounter for screening for respiratory tuberculosis: Secondary | ICD-10-CM | POA: Diagnosis not present

## 2020-03-03 DIAGNOSIS — Z20828 Contact with and (suspected) exposure to other viral communicable diseases: Secondary | ICD-10-CM | POA: Diagnosis not present

## 2020-03-03 DIAGNOSIS — Z03818 Encounter for observation for suspected exposure to other biological agents ruled out: Secondary | ICD-10-CM | POA: Diagnosis not present

## 2020-08-04 DIAGNOSIS — L400 Psoriasis vulgaris: Secondary | ICD-10-CM | POA: Diagnosis not present

## 2020-08-05 DIAGNOSIS — N760 Acute vaginitis: Secondary | ICD-10-CM | POA: Diagnosis not present

## 2020-09-09 DIAGNOSIS — Z6839 Body mass index (BMI) 39.0-39.9, adult: Secondary | ICD-10-CM | POA: Diagnosis not present

## 2020-09-09 DIAGNOSIS — M2550 Pain in unspecified joint: Secondary | ICD-10-CM | POA: Diagnosis not present

## 2020-09-09 DIAGNOSIS — M79641 Pain in right hand: Secondary | ICD-10-CM | POA: Diagnosis not present

## 2020-09-09 DIAGNOSIS — M545 Low back pain, unspecified: Secondary | ICD-10-CM | POA: Diagnosis not present

## 2020-09-09 DIAGNOSIS — M79642 Pain in left hand: Secondary | ICD-10-CM | POA: Diagnosis not present

## 2020-09-09 DIAGNOSIS — E669 Obesity, unspecified: Secondary | ICD-10-CM | POA: Diagnosis not present

## 2020-09-09 DIAGNOSIS — L401 Generalized pustular psoriasis: Secondary | ICD-10-CM | POA: Diagnosis not present

## 2020-10-15 DIAGNOSIS — E538 Deficiency of other specified B group vitamins: Secondary | ICD-10-CM

## 2020-10-15 HISTORY — DX: Deficiency of other specified B group vitamins: E53.8

## 2020-10-29 ENCOUNTER — Encounter: Payer: Self-pay | Admitting: Medical

## 2020-10-29 ENCOUNTER — Other Ambulatory Visit: Payer: Self-pay

## 2020-10-29 ENCOUNTER — Ambulatory Visit (INDEPENDENT_AMBULATORY_CARE_PROVIDER_SITE_OTHER): Payer: No Typology Code available for payment source | Admitting: Medical

## 2020-10-29 VITALS — BP 110/76 | HR 79 | Ht 65.25 in | Wt 239.2 lb

## 2020-10-29 DIAGNOSIS — R200 Anesthesia of skin: Secondary | ICD-10-CM | POA: Insufficient documentation

## 2020-10-29 DIAGNOSIS — R9431 Abnormal electrocardiogram [ECG] [EKG]: Secondary | ICD-10-CM | POA: Diagnosis not present

## 2020-10-29 DIAGNOSIS — R079 Chest pain, unspecified: Secondary | ICD-10-CM | POA: Diagnosis not present

## 2020-10-29 DIAGNOSIS — M1990 Unspecified osteoarthritis, unspecified site: Secondary | ICD-10-CM

## 2020-10-29 DIAGNOSIS — M542 Cervicalgia: Secondary | ICD-10-CM | POA: Diagnosis not present

## 2020-10-29 DIAGNOSIS — L409 Psoriasis, unspecified: Secondary | ICD-10-CM

## 2020-10-29 DIAGNOSIS — E559 Vitamin D deficiency, unspecified: Secondary | ICD-10-CM

## 2020-10-29 DIAGNOSIS — Z6839 Body mass index (BMI) 39.0-39.9, adult: Secondary | ICD-10-CM | POA: Insufficient documentation

## 2020-10-29 DIAGNOSIS — E049 Nontoxic goiter, unspecified: Secondary | ICD-10-CM

## 2020-10-29 NOTE — Progress Notes (Addendum)
Subjective: Chief Complaint  Patient presents with  . New Patient (Initial Visit)    Chest pain and right arm numbness     Dr. Roderic Scarce, dermatology Azucena Fallen, NP at [facility] , last seen 2 years ago Eye doctor Teckla Christiansen, Kermit Balo, PA-C here today to establish care   New patient today to establish care.    Recently diagnosed with psoriasis and arthritis.  Seeing dermatology and rheumatology this year.  Seeing some changes in her body, wanted to come in for physical.  Last PCP over 2 years ago.  She notes in recent months chest pains.  The last one was significant, got her attention.  Right arm going numb at times.  Can start in upper arm and numbness can radiate to hand.   Right handed  Chest pains can last for seconds, intermittent daily now.  Been daily for 5 months.  None were as significant as one big one a few weeks ago.  Chest pains typically only once daily.  No associated SOB, dizziness, lightheaded, nausea, sweats.   No palpitations.    Not exercising in general.  Pains not necessarily associated with activity.  numbness of arm recent x 1 mo.  numbness down right lateral arm to hand.  Has some neck pain.   numbness can be whole numb.  No injury, trauma, fall.  No prior similar.   No concern for sleep apnea.   Past Medical History:  Diagnosis Date  . Acid reflux   . Ectopic pregnancy   . Goiter   . Psoriasis    nails  . Vitamin D deficiency    Current Outpatient Medications on File Prior to Visit  Medication Sig Dispense Refill  . COSENTYX SENSOREADY, 300 MG, 150 MG/ML SOAJ Inject into the skin.     No current facility-administered medications on file prior to visit.   ROS as in subjective   Objective: BP 110/76   Pulse 79   Ht 5' 5.25" (1.657 m)   Wt 239 lb 3.2 oz (108.5 kg)   SpO2 97%   BMI 39.50 kg/m   General appearence: alert, no distress, WD/WN, African-American female Neck: supple, no lymphadenopathy, generalized goiter  without nodule, no masses, nontender normal range of motion Heart: RRR, normal S1, S2, no murmurs Lungs: CTA bilaterally, no wheezes, rhonchi, or rales Abdomen: +bs, soft, non tender, non distended, no masses, no hepatomegaly, no splenomegaly Pulses: 2+ symmetric, upper and lower extremities, normal cap refill No extremity edema Arms nontender, no swelling no decreased range of motion.  There is no deformity consistent with psoriasis bilaterally Arms neurovascularly intact, negative Tinel's and Phalen   EKG indication chest pain, rate 65 bpm, PR 150 ms, QRS 90 ms, QTC 411 ms, axis 77 degrees, normal sinus rhythm, biatrial enlargement, no prior EKG to compare    Assessment: Encounter Diagnoses  Name Primary?  . Chest pain, unspecified type Yes  . Right arm numbness   . Psoriasis   . Arthritis   . Vitamin D deficiency   . BMI 39.0-39.9,adult   . Abnormal EKG   . Goiter   . Neck pain      Plan: We discussed her symptoms and concerns.  We discussed possible differential.  Her chest pains are brief but occurring daily.  I suspect more of a palpitation although she does not use that word.  EKG does show some abnormality including atrial enlargement.  We will go ahead and refer to cardiology for further evaluation  Labs today  to further evaluate concerns  Right arm numbness-discussed possible differential.  Again await labs.  Given her neck pain and arm numbness this could be radicular versus carpal and ulnar tunnel syndrome versus B12 deficiency or other.  Goiter-I reviewed the 2020 ultrasound she had of the thyroid that was stable compared to prior exam  Amarys was seen today for new patient (initial visit).  Diagnoses and all orders for this visit:  Chest pain, unspecified type -     EKG 12-Lead -     Comprehensive metabolic panel -     CBC with Differential/Platelet -     TSH -     Vitamin B12 -     Ambulatory referral to Cardiology  Right arm numbness -     EKG  12-Lead -     Comprehensive metabolic panel -     CBC with Differential/Platelet -     TSH -     Vitamin B12  Psoriasis  Arthritis  Vitamin D deficiency  BMI 39.0-39.9,adult  Abnormal EKG -     Ambulatory referral to Cardiology  Goiter  Neck pain   f/u pending referral, labs

## 2020-10-29 NOTE — Progress Notes (Signed)
Done

## 2020-10-30 ENCOUNTER — Other Ambulatory Visit: Payer: Self-pay | Admitting: Medical

## 2020-10-30 LAB — COMPREHENSIVE METABOLIC PANEL
ALT: 8 IU/L (ref 0–32)
AST: 10 IU/L (ref 0–40)
Albumin/Globulin Ratio: 1.5 (ref 1.2–2.2)
Albumin: 4.2 g/dL (ref 3.8–4.8)
Alkaline Phosphatase: 85 IU/L (ref 44–121)
BUN/Creatinine Ratio: 10 (ref 9–23)
BUN: 8 mg/dL (ref 6–24)
Bilirubin Total: 0.2 mg/dL (ref 0.0–1.2)
CO2: 20 mmol/L (ref 20–29)
Calcium: 8.8 mg/dL (ref 8.7–10.2)
Chloride: 100 mmol/L (ref 96–106)
Creatinine, Ser: 0.8 mg/dL (ref 0.57–1.00)
GFR calc Af Amer: 105 mL/min/{1.73_m2} (ref >59)
GFR calc non Af Amer: 91 mL/min/{1.73_m2} (ref >59)
Globulin, Total: 2.8 g/dL (ref 1.5–4.5)
Glucose: 89 mg/dL (ref 65–99)
Potassium: 4.1 mmol/L (ref 3.5–5.2)
Sodium: 137 mmol/L (ref 134–144)
Total Protein: 7 g/dL (ref 6.0–8.5)

## 2020-10-30 LAB — CBC WITH DIFFERENTIAL/PLATELET
Basophils Absolute: 0 10*3/uL (ref 0.0–0.2)
Basos: 1 %
EOS (ABSOLUTE): 0.3 10*3/uL (ref 0.0–0.4)
Eos: 4 %
Hematocrit: 40.1 % (ref 34.0–46.6)
Hemoglobin: 13.4 g/dL (ref 11.1–15.9)
Immature Grans (Abs): 0 10*3/uL (ref 0.0–0.1)
Immature Granulocytes: 0 %
Lymphocytes Absolute: 2.4 10*3/uL (ref 0.7–3.1)
Lymphs: 38 %
MCH: 31.8 pg (ref 26.6–33.0)
MCHC: 33.4 g/dL (ref 31.5–35.7)
MCV: 95 fL (ref 79–97)
Monocytes Absolute: 0.7 10*3/uL (ref 0.1–0.9)
Monocytes: 11 %
Neutrophils Absolute: 2.9 10*3/uL (ref 1.4–7.0)
Neutrophils: 46 %
Platelets: 278 10*3/uL (ref 150–450)
RBC: 4.21 x10E6/uL (ref 3.77–5.28)
RDW: 13.4 % (ref 11.7–15.4)
WBC: 6.3 10*3/uL (ref 3.4–10.8)

## 2020-10-30 LAB — VITAMIN B12: Vitamin B-12: 205 pg/mL — ABNORMAL LOW (ref 232–1245)

## 2020-10-30 LAB — TSH: TSH: 1.97 u[IU]/mL (ref 0.450–4.500)

## 2020-10-30 MED ORDER — B COMPLEX VITAMINS PO CAPS: 1.0000 | ORAL_CAPSULE | Freq: Every day | ORAL | 0 refills | Status: DC

## 2020-10-30 MED ORDER — VITAMIN B-12 1000 MCG PO TABS: 1000.0000 ug | ORAL_TABLET | Freq: Every day | ORAL | 0 refills | Status: DC

## 2020-11-05 ENCOUNTER — Other Ambulatory Visit: Payer: Self-pay

## 2020-11-05 ENCOUNTER — Other Ambulatory Visit: Payer: No Typology Code available for payment source

## 2020-11-05 DIAGNOSIS — E539 Vitamin B deficiency, unspecified: Secondary | ICD-10-CM

## 2020-11-05 MED ORDER — CYANOCOBALAMIN 1000 MCG/ML IJ SOLN
1000.0000 ug | Freq: Once | INTRAMUSCULAR | Status: AC
  Administered 2020-11-21: 1000 ug via INTRAMUSCULAR

## 2020-11-21 ENCOUNTER — Ambulatory Visit: Payer: No Typology Code available for payment source | Admitting: Cardiology

## 2020-11-21 ENCOUNTER — Encounter: Payer: Self-pay | Admitting: Cardiology

## 2020-11-21 ENCOUNTER — Other Ambulatory Visit: Payer: Self-pay

## 2020-11-21 ENCOUNTER — Other Ambulatory Visit (INDEPENDENT_AMBULATORY_CARE_PROVIDER_SITE_OTHER): Payer: No Typology Code available for payment source

## 2020-11-21 VITALS — BP 142/87 | HR 85 | Ht 65.25 in | Wt 242.0 lb

## 2020-11-21 DIAGNOSIS — R072 Precordial pain: Secondary | ICD-10-CM

## 2020-11-21 DIAGNOSIS — E539 Vitamin B deficiency, unspecified: Secondary | ICD-10-CM

## 2020-11-21 DIAGNOSIS — F17200 Nicotine dependence, unspecified, uncomplicated: Secondary | ICD-10-CM

## 2020-11-21 DIAGNOSIS — E6609 Other obesity due to excess calories: Secondary | ICD-10-CM | POA: Diagnosis not present

## 2020-11-21 DIAGNOSIS — R69 Illness, unspecified: Secondary | ICD-10-CM | POA: Diagnosis not present

## 2020-11-21 DIAGNOSIS — E538 Deficiency of other specified B group vitamins: Secondary | ICD-10-CM

## 2020-11-21 DIAGNOSIS — Z6839 Body mass index (BMI) 39.0-39.9, adult: Secondary | ICD-10-CM | POA: Diagnosis not present

## 2020-11-21 NOTE — Progress Notes (Signed)
Date:  11/21/2020   ID:  Veronica Contreras, DOB 08/01/1976, MRN [id number]  PCP:  Carlena Hurl, PA-C  Cardiologist:  Rex Kras, DO, [facility] (established care 11/21/2020)  REASON FOR CONSULT: Chest Pain  REQUESTING PHYSICIAN:  Carlena Hurl, PA-C [address] [place],  [place] [address]  Chief Complaint  Patient presents with  . Chest Pain    HPI  Veronica Contreras is a 45 y.o. female who presents to the office with a chief complaint of " chest pain."   She is referred to the office at the request of Carlena Hurl, PA-C for evaluation of chest pain.  Patient states that she been having chest discomfort for the last 6 months.  It occurs every day, last for a few seconds and it is self-limited.  The discomfort is located predominantly substernally and over the left breast, intensity 3 out of 10, nonradiating, no improving or worsening factors, not related to health related activities and does not improve with rest.  No prior cardiovascular testing.  Patient states that she thought her symptoms were due to GERD and did not seek medical attention sooner.  However since the symptoms are still continuing she requested a referral to cardiology for further evaluation and management  No family history of premature coronary disease or sudden cardiac death.  FUNCTIONAL STATUS: No structured exercise program or daily routine.   ALLERGIES: Allergies  Allergen Reactions  . Vicodin [Hydrocodone-Acetaminophen] Nausea And Vomiting    MEDICATION LIST PRIOR TO VISIT: Current Meds  Medication Sig  . b complex vitamins capsule Take 1 capsule by mouth daily.  . COSENTYX SENSOREADY, 300 MG, 150 MG/ML SOAJ Inject into the skin.  Marland Kitchen ibuprofen (ADVIL) 200 MG tablet Take 200 mg by mouth every 6 (six) hours as needed.   Current Facility-Administered Medications for the 11/21/20 encounter (Office Visit) with Rex Kras, DO  Medication  . cyanocobalamin ((VITAMIN B-12)) injection 1,000  mcg     PAST MEDICAL HISTORY: Past Medical History:  Diagnosis Date  . Acid reflux   . Ectopic pregnancy   . Goiter   . Psoriasis    nails  . Vitamin D deficiency     PAST SURGICAL HISTORY: Past Surgical History:  Procedure Laterality Date  . LAPAROSCOPY FOR ECTOPIC PREGNANCY     Salpingectomy  . SALPINGECTOMY     R/T ectopic pregnancy    FAMILY HISTORY: The patient family history includes Aneurysm in her paternal grandmother; Arthritis in her brother; Breast cancer in her maternal aunt; Cancer in her maternal aunt; Diabetes in her mother; Heart disease in her paternal grandfather; Hypertension in her mother; Stroke in her maternal grandmother.  SOCIAL HISTORY:  The patient  reports that she has been smoking. She has never used smokeless tobacco. She reports current alcohol use. She reports previous drug use. Frequency: 1.00 time per week. Drug: Marijuana.  REVIEW OF SYSTEMS: Review of Systems  Constitutional: Negative for chills and fever.  HENT: Negative for hoarse voice and nosebleeds.   Eyes: Negative for discharge, double vision and pain.  Cardiovascular: Positive for chest pain. Negative for claudication, dyspnea on exertion, leg swelling, near-syncope, orthopnea, palpitations, paroxysmal nocturnal dyspnea and syncope.  Respiratory: Negative for hemoptysis and shortness of breath.   Musculoskeletal: Negative for muscle cramps and myalgias.  Gastrointestinal: Negative for abdominal pain, constipation, diarrhea, hematemesis, hematochezia, melena, nausea and vomiting.  Neurological: Negative for dizziness and light-headedness.    PHYSICAL EXAM: Vitals with BMI 11/21/2020 10/29/2020 09/13/2018  Height  5' 5.25" 5' 5.25" 5' 5.25"  Weight 242 lbs 239 lbs 3 oz (No Data)  BMI 39.98 39.52 -  Systolic 142 110 423  Diastolic 87 76 87  Pulse 85 79 77    CONSTITUTIONAL: Well-developed and well-nourished. No acute distress.  SKIN: Skin is warm and dry. No rash noted. No  cyanosis. No pallor. No jaundice HEAD: Normocephalic and atraumatic.  EYES: No scleral icterus MOUTH/THROAT: Moist oral membranes.  NECK: No JVD present. No thyromegaly noted. No carotid bruits  LYMPHATIC: No visible cervical adenopathy.  CHEST Normal respiratory effort. No intercostal retractions  LUNGS: Clear to auscultation bilaterally. No stridor. No wheezes. No rales.  CARDIOVASCULAR: Regular rate and rhythm, positive S1-S2, no murmurs rubs or gallops appreciated. ABDOMINAL: Obese, soft, nontender, nondistended, positive bowel sounds all 4 quadrants. No apparent ascites.  EXTREMITIES: No peripheral edema  HEMATOLOGIC: No significant bruising NEUROLOGIC: Oriented to person, place, and time. Nonfocal. Normal muscle tone.  PSYCHIATRIC: Normal mood and affect. Normal behavior. Cooperative  CARDIAC DATABASE: EKG: 11/21/2020, normal sinus rhythm, 79 bpm, normal axis, poor R wave progression, without underlying ischemia or injury pattern.  Echocardiogram: No results found for this or any previous visit from the past 1095 days.   Stress Testing: No results found for this or any previous visit from the past 1095 days.  Heart Catheterization: None  LABORATORY DATA: CBC Latest Ref Rng & Units 10/29/2020 05/05/2009 08/16/2007  WBC 3.4 - 10.8 x10E3/uL 6.3 4.9 5.7  Hemoglobin 11.1 - 15.9 g/dL 53.6 14.4 31.5  Hematocrit 34.0 - 46.6 % 40.1 39.5 39.2  Platelets 150 - 450 x10E3/uL 278 263 265    CMP Latest Ref Rng & Units 10/29/2020 05/05/2009 08/13/2007  Glucose 65 - 99 mg/dL 89 87 -  BUN 6 - 24 mg/dL 8 8 10   Creatinine 0.57 - 1.00 mg/dL 4.00 8.67  Sodium 134 - 144 mmol/L 137 137 -  Potassium 3.5 - 5.2 mmol/L 4.1 4.2 -  Chloride 96 - 106 mmol/L 100 105 -  CO2 20 - 29 mmol/L 20 25 -  Calcium 8.7 - 10.2 mg/dL 8.8 8.7 -  Total Protein 6.0 - 8.5 g/dL 7.0 7.1 -  Total Bilirubin 0.0 - 1.2 mg/dL 0.2 0.7 -  Alkaline Phos 44 - 121 IU/L 85 79 -  AST 0 - 40 IU/L 10 20 18   ALT 0 - 32 IU/L 8  19 -    Lipid Panel  No results found for: CHOL, TRIG, HDL, CHOLHDL, VLDL, LDLCALC, LDLDIRECT, LABVLDL  No components found for: NTPROBNP No results for input(s): PROBNP in the last 8760 hours. Recent Labs    10/29/20 1538  TSH 1.970    BMP Recent Labs    10/29/20 1538  NA 137  K 4.1  CL 100  CO2 20  GLUCOSE 89  BUN 8  CREATININE 0.80  CALCIUM 8.8  GFRNONAA 91  GFRAA 105    HEMOGLOBIN A1C No results found for: HGBA1C, MPG  IMPRESSION:    ICD-10-CM   1. Precordial pain  R07.2 EKG 12-Lead    PCV ECHOCARDIOGRAM COMPLETE    PCV CARDIAC STRESS TEST    SARS-COV-2 RNA,(COVID-19) QUAL NAAT  2. Smoking  F17.200   3. Class 2 obesity due to excess calories without serious comorbidity with body mass index (BMI) of 39.0 to 39.9 in adult  E66.09    Z68.39      RECOMMENDATIONS: Veronica Contreras is a 45 y.o. female whose past medical history and cardiac risk factors  include: Intermittent cigarette smoking, obesity due to excess calories.  Precordial pain:  Patient symptoms of chest discomfort appear to be atypical in nature.    Echocardiogram will be ordered to evaluate for structural heart disease and left ventricular systolic function.  Exercise treadmill stress test to evaluate for exercise-induced ischemia and chronotropic competence  Covid screen prior to GXT  Patient has a yearly physical scheduled in March 2022.  I recommended that she have her hemoglobin A1c and lipid profile checked to screen for diabetes and hyperlipidemia respectively.  Educated on risk factor modifications.  Patient is asked to seek medical attention by going to the closest ER via EMS if her symptoms of atypical chest pain increase in intensity, frequency, duration or has typical chest pain as discussed during today's encounter.   Smoking:  Patient states that she intermittently smokes 1 or 2 cigarettes.  She is planning on quitting.  Tobacco cessation counseling:  Patient was  informed of the dangers of tobacco abuse including stroke, cancer, and MI, as well as benefits of tobacco cessation.  Patient is willing to quit at this time.  Approximately 5 mins were spent counseling patient cessation techniques. We discussed various methods to help quit smoking, including deciding on a date to quit, joining a support group, pharmacological agents- nicotine gum/patch/lozenges, chantix.   I will reassess her progress at the next follow-up visit  Obesity, due to excess calories: . Body mass index is 39.96 kg/m. . I reviewed with the patient the importance of diet, regular physical activity/exercise, weight loss.   . Patient is educated on increasing physical activity gradually as tolerated.  With the goal of moderate intensity exercise for 30 minutes a day 5 days a week.  FINAL MEDICATION LIST END OF ENCOUNTER: No orders of the defined types were placed in this encounter.   Medications Discontinued During This Encounter  Medication Reason  . vitamin B-12 (CYANOCOBALAMIN) 1000 MCG tablet Completed Course     Current Outpatient Medications:  .  b complex vitamins capsule, Take 1 capsule by mouth daily., Disp: 90 capsule, Rfl: 0 .  COSENTYX SENSOREADY, 300 MG, 150 MG/ML SOAJ, Inject into the skin., Disp: , Rfl:  .  ibuprofen (ADVIL) 200 MG tablet, Take 200 mg by mouth every 6 (six) hours as needed., Disp: , Rfl:   Current Facility-Administered Medications:  .  cyanocobalamin ((VITAMIN B-12)) injection 1,000 mcg, 1,000 mcg, Intramuscular, Once, Tysinger, Kermit Balo, PA-C  Orders Placed This Encounter  Procedures  . SARS-COV-2 RNA,(COVID-19) QUAL NAAT  . PCV CARDIAC STRESS TEST  . EKG 12-Lead  . PCV ECHOCARDIOGRAM COMPLETE    There are no Patient Instructions on file for this visit.   --Continue cardiac medications as reconciled in final medication list. --Return in about 6 weeks (around 01/02/2021) for Follow up, Chest pain, Review test results. Or sooner if  needed. --Continue follow-up with your primary care physician regarding the management of your other chronic comorbid conditions.  Patient's questions and concerns were addressed to her satisfaction. She voices understanding of the instructions provided during this encounter.   This note was created using a voice recognition software as a result there may be grammatical errors inadvertently enclosed that do not reflect the nature of this encounter. Every attempt is made to correct such errors.  Tessa Lerner, [place], [facility]  Pager: [phone] Office: [phone]

## 2020-12-04 ENCOUNTER — Other Ambulatory Visit (HOSPITAL_COMMUNITY): Payer: Self-pay

## 2020-12-04 ENCOUNTER — Other Ambulatory Visit (HOSPITAL_COMMUNITY)
Admission: RE | Admit: 2020-12-04 | Discharge: 2020-12-04 | Disposition: A | Discharge status: Home / Self Care | Payer: No Typology Code available for payment source | Source: Ambulatory Visit | Attending: Cardiology | Admitting: Cardiology

## 2020-12-04 DIAGNOSIS — Z20822 Contact with and (suspected) exposure to covid-19: Secondary | ICD-10-CM | POA: Diagnosis not present

## 2020-12-04 DIAGNOSIS — L400 Psoriasis vulgaris: Secondary | ICD-10-CM | POA: Diagnosis not present

## 2020-12-04 DIAGNOSIS — Z01812 Encounter for preprocedural laboratory examination: Secondary | ICD-10-CM | POA: Diagnosis not present

## 2020-12-04 DIAGNOSIS — L218 Other seborrheic dermatitis: Secondary | ICD-10-CM | POA: Diagnosis not present

## 2020-12-04 LAB — SARS CORONAVIRUS 2 (TAT 6-24 HRS): SARS Coronavirus 2: NEGATIVE

## 2020-12-05 ENCOUNTER — Other Ambulatory Visit (HOSPITAL_COMMUNITY): Payer: Self-pay

## 2020-12-08 ENCOUNTER — Ambulatory Visit: Payer: No Typology Code available for payment source

## 2020-12-08 ENCOUNTER — Other Ambulatory Visit: Payer: Self-pay

## 2020-12-08 DIAGNOSIS — R072 Precordial pain: Secondary | ICD-10-CM | POA: Diagnosis not present

## 2020-12-29 NOTE — Progress Notes (Signed)
Spoke to patient she needs to reschedule due to her bill

## 2020-12-29 NOTE — Progress Notes (Signed)
Spoke to patient she is aware

## 2021-01-02 ENCOUNTER — Ambulatory Visit: Payer: No Typology Code available for payment source | Admitting: Cardiology

## 2021-02-03 ENCOUNTER — Ambulatory Visit (INDEPENDENT_AMBULATORY_CARE_PROVIDER_SITE_OTHER): Payer: No Typology Code available for payment source | Admitting: Medical

## 2021-02-03 ENCOUNTER — Other Ambulatory Visit: Payer: Self-pay

## 2021-02-03 ENCOUNTER — Encounter: Payer: Self-pay | Admitting: Medical

## 2021-02-03 VITALS — BP 130/84 | HR 70 | Ht 65.0 in | Wt 243.4 lb

## 2021-02-03 DIAGNOSIS — Z0000 Encounter for general adult medical examination without abnormal findings: Secondary | ICD-10-CM

## 2021-02-03 DIAGNOSIS — Z13220 Encounter for screening for lipoid disorders: Secondary | ICD-10-CM | POA: Diagnosis not present

## 2021-02-03 DIAGNOSIS — Z113 Encounter for screening for infections with a predominantly sexual mode of transmission: Secondary | ICD-10-CM | POA: Diagnosis not present

## 2021-02-03 DIAGNOSIS — Z124 Encounter for screening for malignant neoplasm of cervix: Secondary | ICD-10-CM | POA: Insufficient documentation

## 2021-02-03 DIAGNOSIS — G479 Sleep disorder, unspecified: Secondary | ICD-10-CM | POA: Diagnosis not present

## 2021-02-03 DIAGNOSIS — L409 Psoriasis, unspecified: Secondary | ICD-10-CM

## 2021-02-03 DIAGNOSIS — L4050 Arthropathic psoriasis, unspecified: Secondary | ICD-10-CM | POA: Diagnosis not present

## 2021-02-03 DIAGNOSIS — Z7185 Encounter for immunization safety counseling: Secondary | ICD-10-CM

## 2021-02-03 DIAGNOSIS — Z23 Encounter for immunization: Secondary | ICD-10-CM | POA: Diagnosis not present

## 2021-02-03 DIAGNOSIS — E559 Vitamin D deficiency, unspecified: Secondary | ICD-10-CM

## 2021-02-03 DIAGNOSIS — R0683 Snoring: Secondary | ICD-10-CM

## 2021-02-03 DIAGNOSIS — E538 Deficiency of other specified B group vitamins: Secondary | ICD-10-CM | POA: Diagnosis not present

## 2021-02-03 DIAGNOSIS — E049 Nontoxic goiter, unspecified: Secondary | ICD-10-CM

## 2021-02-03 DIAGNOSIS — Z1231 Encounter for screening mammogram for malignant neoplasm of breast: Secondary | ICD-10-CM | POA: Diagnosis not present

## 2021-02-03 DIAGNOSIS — Z6841 Body Mass Index (BMI) 40.0 and over, adult: Secondary | ICD-10-CM

## 2021-02-03 DIAGNOSIS — Z1159 Encounter for screening for other viral diseases: Secondary | ICD-10-CM | POA: Insufficient documentation

## 2021-02-03 DIAGNOSIS — R072 Precordial pain: Secondary | ICD-10-CM

## 2021-02-03 DIAGNOSIS — R69 Illness, unspecified: Secondary | ICD-10-CM | POA: Diagnosis not present

## 2021-02-03 LAB — HEPATITIS C ANTIBODY

## 2021-02-03 LAB — HEPATITIS B SURFACE ANTIGEN

## 2021-02-03 LAB — LIPID PANEL: Triglycerides: 129 mg/dL (ref 0–149)

## 2021-02-03 NOTE — Progress Notes (Signed)
Subjective:   HPI  Veronica Contreras is a 45 y.o. female who presents for Chief Complaint  Patient presents with  . Annual Exam    Pt present for fasting physical. Pt has no other concerns. Due for tetanus vaccine     Patient Care Team: Sheyenne Konz, Kermit Balo, PA-C as PCP - General (Family Medicine) Sees dentist Sees eye doctor Medical Team: No dentist currently No current eye doctor Dr. Tessa Lerner, cardiology Dr. Azucena Fallen, [facility] Rheumatology Dr. Theador Hawthorne, [facility] Dermatology   Concerns: Hx/o psoriatric arthritis - seeing rheumatology  Hx/o right foot fx, 5th metatarsal, no fam hx/o osteo  Vit B12 deficiency diagnosed 10/2020, had 2 injections here, been using oral B12 since January  Vit D deficiency - not currently on supplement  Gynecological history: 1 pregnancies, 0 live births, ectopic Engaged, no concern for STD or pregnancy.  No desire to be on contraception  Reviewed their medical, surgical, family, social, medication, and allergy history and updated chart as appropriate.  Past Medical History:  Diagnosis Date  . Acid reflux   . B12 deficiency 10/2020  . Ectopic pregnancy   . Foot fracture, right    prior  . Goiter   . Psoriasis    nails  . Vitamin D deficiency     Family History  Problem Relation Age of Onset  . Hypertension Mother   . Diabetes Mother        complication of medication  . Cancer Maternal Aunt        breast  . Breast cancer Maternal Aunt   . Arthritis Brother   . Stroke Maternal Grandmother   . Aneurysm Paternal Grandmother   . Heart disease Paternal Grandfather        cABG     Current Outpatient Medications:  .  b complex vitamins capsule, Take 1 capsule by mouth daily., Disp: 90 capsule, Rfl: 0 .  COSENTYX SENSOREADY, 300 MG, 150 MG/ML SOAJ, Inject into the skin., Disp: , Rfl:   Allergies  Allergen Reactions  . Vicodin [Hydrocodone-Acetaminophen] Nausea And Vomiting      Review of  Systems Constitutional: -fever, -chills, -sweats, -unexpected weight change, -decreased appetite, -fatigue Allergy: -sneezing, -itching, -congestion Dermatology: -changing moles, --rash, -lumps ENT: -runny nose, -ear pain, -sore throat, -hoarseness, -sinus pain, -teeth pain, - ringing in ears, -hearing loss, -nosebleeds Cardiology: -chest pain, -palpitations, -swelling, -difficulty breathing when lying flat, -waking up short of breath Respiratory: -cough, -shortness of breath, -difficulty breathing with exercise or exertion, -wheezing, -coughing up blood Gastroenterology: -abdominal pain, -nausea, -vomiting, -diarrhea, -constipation, -blood in stool, -changes in bowel movement, -difficulty swallowing or eating Hematology: -bleeding, -bruising  Musculoskeletal: -joint aches, -muscle aches, -joint swelling, -back pain, -neck pain, -cramping, -changes in gait Ophthalmology: denies vision changes, eye redness, itching, discharge Urology: -burning with urination, -difficulty urinating, -blood in urine, -urinary frequency, -urgency, -incontinence Neurology: -headache, -weakness, -tingling, -numbness, -memory loss, -falls, -dizziness Psychology: -depressed mood, -agitation, -sleep problems Breast/gyn: -breast tendnerss, -discharge, -lumps, -vaginal discharge,- irregular periods, -heavy periods     Objective:  BP 130/84   Pulse 70   Ht 5\' 5"  (1.651 m)   Wt 243 lb 6.4 oz (110.4 kg)   LMP 02/01/2021 (Exact Date)   SpO2 96%   BMI 40.50 kg/m   General appearance: alert, no distress, WD/WN, African American female Skin: few small 1-58mm flat whitish colored macules on legs scattered, upper and lower HEENT: normocephalic, conjunctiva/corneas normal, sclerae anicteric, PERRLA, EOMi, nares patent, no discharge or erythema, pharynx normal  Oral cavity: MMM, tongue normal, teeth normal Neck: supple, no lymphadenopathy, generalized thyromegaly goiter, no specific nodules, no masses, normal ROM, no  bruits Chest: non tender, normal shape and expansion Heart: RRR, normal S1, S2, no murmurs Lungs: CTA bilaterally, no wheezes, rhonchi, or rales Abdomen: +bs, soft, non tender, non distended, no masses, no hepatomegaly, no splenomegaly, no bruits Back: non tender, normal ROM, no scoliosis Musculoskeletal: upper extremities non tender, no obvious deformity, normal ROM throughout, lower extremities non tender, no obvious deformity, normal ROM throughout Extremities: no edema, no cyanosis, no clubbing Pulses: 2+ symmetric, upper and lower extremities, normal cap refill Neurological: alert, oriented x 3, CN2-12 intact, strength normal upper extremities and lower extremities, sensation normal throughout, DTRs 2+ throughout, no cerebellar signs, gait normal Psychiatric: normal affect, behavior normal, pleasant  Breast/gyn/rectal - deferred due to menstruating    Assessment and Plan :   Encounter Diagnoses  Name Primary?  . Encounter for health maintenance examination in adult Yes  . Screen for STD (sexually transmitted disease)   . Encounter for hepatitis C screening test for low risk patient   . Vitamin D deficiency   . Psoriasis   . Goiter   . Psoriatic arthritis (HCC)   . Encounter for screening mammogram for malignant neoplasm of breast   . Screening for cervical cancer   . Sleep disturbance   . Snoring   . Vaccine counseling   . Need for Td vaccine   . Screening for lipid disorders   . B12 deficiency   . BMI 40.0-44.9, adult (HCC)   . Precordial chest pain     Today you had a preventative care visit or wellness visit.    Topics today may have included healthy lifestyle, diet, exercise, preventative care, vaccinations, sick and well care, proper use of emergency dept and after hours care, as well as other concerns.     Recommendations: Continue to return yearly for your annual wellness and preventative care visits.  This gives [place] a chance to discuss healthy lifestyle,  exercise, vaccinations, review your chart record, and perform screenings where appropriate.  I recommend you see your eye doctor yearly for routine vision care.  I recommend you see your dentist yearly for routine dental care including hygiene visits twice yearly.   Vaccination recommendations were reviewed  I recommend a yearly flu shot in the fall  You are up to date on Covid vaccine  We are updating your tetanus booster today  Counseled on the Td (tetanus, diptheria) vaccine.  Vaccine information sheet given. Td vaccine given after consent obtained.     Screening for cancer: Breast cancer screening: You should perform a self breast exam monthly.   We reviewed recommendations for regular mammograms and breast cancer screening. Return at your convenience for breast and pap smear exam as you were menstruating today  Colon cancer screening:  Age 47yo  Cervical cancer screening: We reviewed recommendations for pap smear screening.  You are due now  Skin cancer screening: Check your skin regularly for new changes, growing lesions, or other lesions of concern Come in for evaluation if you have skin lesions of concern.  Lung cancer screening: If you have a greater than 30 pack year history of tobacco use, then you qualify for lung cancer screening with a chest CT scan  We currently don't have screenings for other cancers besides breast, cervical, colon, and lung cancers.  If you have a strong family history of cancer or have other cancer screening concerns,  please let me know.    Bone health: Get at least 150 minutes of aerobic exercise weekly Get weight bearing exercise at least once weekly You may need to have a bone density in your mid 50s due to history of fracture as and adult and vitamin D deficiency   Heart health: Get at least 150 minutes of aerobic exercise weekly Limit alcohol It is important to maintain a healthy blood pressure and healthy cholesterol  numbers  Follow up with cardiology as planned    Separate significant issues discussed: B12 deficiency diagnosed December 2021-received 2 injections here and has been doing oral supplement.  Repeat labs today  Vitamin D deficiency-labs today, will likely need to be back on supplement  Psoriatic arthritis-managed by rheumatology  Psoriasis of nails-managed by dermatology  Goiter-normal TSH in December 2021, reviewed ultrasound in the chart regular.  Consider updated ultrasound.  No particular symptoms  BMI greater than 40-I recommend you work on losing weight through healthy eating habits, regular exercise.  Cut out any obvious junk food or high sugar foods.  Limit eating out.  Try to exercise 150 minutes or more per week  Precordial chest pain - f/u with cardiology   Shaunna was seen today for annual exam.  Diagnoses and all orders for this visit:  Encounter for health maintenance examination in adult -     HIV Antibody (routine testing w rflx) -     RPR -     Hepatitis C antibody -     Hepatitis B surface antigen -     Lipid panel -     VITAMIN D 25 Hydroxy (Vit-D Deficiency, Fractures) -     Vitamin B12  Screen for STD (sexually transmitted disease) -     HIV Antibody (routine testing w rflx) -     RPR -     Hepatitis C antibody -     Hepatitis B surface antigen  Encounter for hepatitis C screening test for low risk patient  Vitamin D deficiency -     VITAMIN D 25 Hydroxy (Vit-D Deficiency, Fractures)  Psoriasis  Goiter  Psoriatic arthritis (HCC)  Encounter for screening mammogram for malignant neoplasm of breast  Screening for cervical cancer  Sleep disturbance  Snoring  Vaccine counseling  Need for Td vaccine  Screening for lipid disorders -     Lipid panel  B12 deficiency -     Vitamin B12  BMI 40.0-44.9, adult (HCC)  Precordial chest pain  Other orders -     Flu Vaccine QUAD 6+ mos PF IM (Fluarix Quad PF) -     Td : Tetanus/diphtheria  >7yo Preservative  free     Follow-up pending labs, recheck soon for pap w/HPV and GC/chlam, and breast exam

## 2021-02-03 NOTE — Patient Instructions (Addendum)
HAPPY BIRTHDAY!!!!   EYE DOCTOR RECOMMENDATIONS  [facility] [address], [place], [place] [address] Phone: [phone] [email or web address]   [facility] Dr. Gelene Mink [address], [place] [place], [place] [address]  [phone] [email or web address]   Vincenza Hews, M.D. Susanne Greenhouse, O.D. [address] [place], [place] [address] Medical telephone: [phone] Optical telephone: [phone]    DENTIST RECOMMENDATION Dr. Yancey Flemings, dentist [address], [place], [place] [address] [phone] [email or web address]     Today you had a preventative care visit or wellness visit.    Topics today may have included healthy lifestyle, diet, exercise, preventative care, vaccinations, sick and well care, proper use of emergency dept and after hours care, as well as other concerns.     Recommendations: Continue to return yearly for your annual wellness and preventative care visits.  This gives [place] a chance to discuss healthy lifestyle, exercise, vaccinations, review your chart record, and perform screenings where appropriate.  I recommend you see your eye doctor yearly for routine vision care.  I recommend you see your dentist yearly for routine dental care including hygiene visits twice yearly.   Vaccination recommendations were reviewed  I recommend a yearly flu shot in the fall  You are up to date on Covid vaccine  We are updating your tetanus booster today  Counseled on the Td (tetanus, diptheria) vaccine.  Vaccine information sheet given. Td vaccine given after consent obtained.     Screening for cancer: Breast cancer screening: You should perform a self breast exam monthly.   We reviewed recommendations for regular mammograms and breast cancer screening. Return at your convenience for breast and pap smear exam as you were menstruating today  Colon cancer screening:  Age 45yo  Cervical  cancer screening: We reviewed recommendations for pap smear screening.  You are due now  Skin cancer screening: Check your skin regularly for new changes, growing lesions, or other lesions of concern Come in for evaluation if you have skin lesions of concern.  Lung cancer screening: If you have a greater than 30 pack year history of tobacco use, then you qualify for lung cancer screening with a chest CT scan  We currently don't have screenings for other cancers besides breast, cervical, colon, and lung cancers.  If you have a strong family history of cancer or have other cancer screening concerns, please let me know.    Bone health: Get at least 150 minutes of aerobic exercise weekly Get weight bearing exercise at least once weekly You may need to have a bone density in your mid 50s due to history of fracture as and adult and vitamin D deficinecy   Heart health: Get at least 150 minutes of aerobic exercise weekly Limit alcohol It is important to maintain a healthy blood pressure and healthy cholesterol numbers  Follow up with cardiology as planned    Separate significant issues discussed: B12 deficiency diagnosed December 2021-received 2 injections here and has been doing oral supplement.  Repeat labs today  Vitamin D deficiency-labs today, will likely need to be back on supplement  Psoriatic arthritis-managed by rheumatology  Psoriasis of nails-managed by dermatology  Goiter-normal TSH in December 2021, reviewed ultrasound in the chart regular.  Consider updated ultrasound.  No particular symptoms  BMI greater than 40-I recommend you work on losing weight through healthy eating habits, regular exercise.  Cut out any obvious junk food or high sugar foods.  Limit eating out.  Try  to exercise 150 minutes or more per week

## 2021-02-04 ENCOUNTER — Other Ambulatory Visit: Payer: Self-pay | Admitting: Medical

## 2021-02-04 DIAGNOSIS — E049 Nontoxic goiter, unspecified: Secondary | ICD-10-CM

## 2021-02-04 DIAGNOSIS — Z6841 Body Mass Index (BMI) 40.0 and over, adult: Secondary | ICD-10-CM

## 2021-02-04 LAB — VITAMIN B12: Vitamin B-12: 310 pg/mL (ref 232–1245)

## 2021-02-04 LAB — LIPID PANEL
Chol/HDL Ratio: 4.7 ratio — ABNORMAL HIGH (ref 0.0–4.4)
Cholesterol, Total: 206 mg/dL — ABNORMAL HIGH (ref 100–199)
HDL: 44 mg/dL (ref >39)
LDL Chol Calc (NIH): 139 mg/dL — ABNORMAL HIGH (ref 0–99)
VLDL Cholesterol Cal: 23 mg/dL (ref 5–40)

## 2021-02-04 LAB — RPR: RPR Ser Ql: NONREACTIVE

## 2021-02-04 LAB — VITAMIN D 25 HYDROXY (VIT D DEFICIENCY, FRACTURES): Vit D, 25-Hydroxy: 22.7 ng/mL — ABNORMAL LOW (ref 30.0–100.0)

## 2021-02-04 LAB — HIV ANTIBODY (ROUTINE TESTING W REFLEX): HIV Screen 4th Generation wRfx: NONREACTIVE

## 2021-02-04 MED ORDER — VITAMIN D 25 MCG (1000 UNIT) PO TABS: 1000.0000 [IU] | ORAL_TABLET | Freq: Every day | ORAL | 3 refills | Status: DC

## 2021-02-05 ENCOUNTER — Other Ambulatory Visit: Payer: Self-pay

## 2021-02-05 ENCOUNTER — Encounter: Payer: Self-pay | Admitting: Cardiology

## 2021-02-05 ENCOUNTER — Other Ambulatory Visit (HOSPITAL_COMMUNITY)
Admission: RE | Admit: 2021-02-05 | Discharge: 2021-02-05 | Disposition: A | Discharge status: Home / Self Care | Payer: No Typology Code available for payment source | Source: Ambulatory Visit | Attending: Medical | Admitting: Medical

## 2021-02-05 ENCOUNTER — Encounter: Payer: Self-pay | Admitting: Medical

## 2021-02-05 ENCOUNTER — Ambulatory Visit: Payer: No Typology Code available for payment source | Admitting: Cardiology

## 2021-02-05 ENCOUNTER — Ambulatory Visit
Admission: RE | Admit: 2021-02-05 | Discharge: 2021-02-05 | Disposition: A | Discharge status: Home / Self Care | Payer: No Typology Code available for payment source | Source: Ambulatory Visit | Attending: Medical | Admitting: Medical

## 2021-02-05 ENCOUNTER — Ambulatory Visit (INDEPENDENT_AMBULATORY_CARE_PROVIDER_SITE_OTHER): Payer: No Typology Code available for payment source | Admitting: Medical

## 2021-02-05 VITALS — BP 126/73 | HR 84 | Temp 98.1°F | Ht 65.0 in | Wt 243.0 lb

## 2021-02-05 VITALS — BP 130/84 | HR 88 | Ht 65.0 in | Wt 244.2 lb

## 2021-02-05 DIAGNOSIS — E538 Deficiency of other specified B group vitamins: Secondary | ICD-10-CM | POA: Diagnosis not present

## 2021-02-05 DIAGNOSIS — Z87891 Personal history of nicotine dependence: Secondary | ICD-10-CM | POA: Diagnosis not present

## 2021-02-05 DIAGNOSIS — Z1231 Encounter for screening mammogram for malignant neoplasm of breast: Secondary | ICD-10-CM

## 2021-02-05 DIAGNOSIS — G479 Sleep disorder, unspecified: Secondary | ICD-10-CM

## 2021-02-05 DIAGNOSIS — Z124 Encounter for screening for malignant neoplasm of cervix: Secondary | ICD-10-CM | POA: Insufficient documentation

## 2021-02-05 DIAGNOSIS — Z6841 Body Mass Index (BMI) 40.0 and over, adult: Secondary | ICD-10-CM

## 2021-02-05 DIAGNOSIS — E049 Nontoxic goiter, unspecified: Secondary | ICD-10-CM

## 2021-02-05 DIAGNOSIS — E669 Obesity, unspecified: Secondary | ICD-10-CM | POA: Diagnosis not present

## 2021-02-05 DIAGNOSIS — E041 Nontoxic single thyroid nodule: Secondary | ICD-10-CM | POA: Diagnosis not present

## 2021-02-05 DIAGNOSIS — R072 Precordial pain: Secondary | ICD-10-CM

## 2021-02-05 DIAGNOSIS — K219 Gastro-esophageal reflux disease without esophagitis: Secondary | ICD-10-CM

## 2021-02-05 DIAGNOSIS — Z712 Person consulting for explanation of examination or test findings: Secondary | ICD-10-CM | POA: Diagnosis not present

## 2021-02-05 DIAGNOSIS — M2550 Pain in unspecified joint: Secondary | ICD-10-CM | POA: Diagnosis not present

## 2021-02-05 DIAGNOSIS — M25561 Pain in right knee: Secondary | ICD-10-CM | POA: Diagnosis not present

## 2021-02-05 DIAGNOSIS — R0683 Snoring: Secondary | ICD-10-CM

## 2021-02-05 DIAGNOSIS — L401 Generalized pustular psoriasis: Secondary | ICD-10-CM | POA: Diagnosis not present

## 2021-02-05 DIAGNOSIS — L4059 Other psoriatic arthropathy: Secondary | ICD-10-CM | POA: Diagnosis not present

## 2021-02-05 NOTE — Progress Notes (Signed)
Date:  02/05/2021   ID:  Veronica Contreras, DOB September 26, 1976, MRN [id number]  PCP:  Jac Canavan, PA-C  Cardiologist:  Tessa Lerner, DO, [facility] (established care 11/21/2020)  Date: 02/05/21 Last Office Visit: 11/21/2020  Chief Complaint  Patient presents with  . Chest Pain  . Follow-up    HPI  Veronica Contreras is a 45 y.o. female who presents to the office with a chief complaint of " chest pain."   She is referred to the office at the request of Jac Canavan, PA-C for evaluation of chest pain.  Patient presents here for follow-up for reevaluation of chest pain and review test results.  She was last seen in January 2022 for chest discomfort.  Her symptoms were predominantly atypical and therefore was scheduled to have a exercise stress test as well as an echocardiogram.  The exercise stress test noted no evidence of ischemia on stress ECG and echocardiogram notes preserved LVEF.  Results noted below for further reference.  And discussed with the patient at today's office visit.  Clinically patient states that she is doing well from a cardiovascular standpoint.  She has not had any reoccurrence of chest pain or heart failure symptoms.  Patient plans to start increasing her physical activity to facilitate weight loss.  No family history of premature coronary disease or sudden cardiac death.  FUNCTIONAL STATUS: No structured exercise program or daily routine.   ALLERGIES: Allergies  Allergen Reactions  . Vicodin [Hydrocodone-Acetaminophen] Nausea And Vomiting    MEDICATION LIST PRIOR TO VISIT: Current Meds  Medication Sig  . b complex vitamins capsule Take 1 capsule by mouth daily.  . cholecalciferol (VITAMIN D3) 25 MCG (1000 UNIT) tablet Take 1 tablet (1,000 Units total) by mouth daily.  . COSENTYX SENSOREADY, 300 MG, 150 MG/ML SOAJ Inject into the skin.  . meloxicam (MOBIC) 15 MG tablet Take 15 mg by mouth daily.     PAST MEDICAL HISTORY: Past Medical History:   Diagnosis Date  . Acid reflux   . B12 deficiency 10/2020  . Ectopic pregnancy   . Foot fracture, right    prior  . Goiter   . Psoriasis    nails  . Vitamin D deficiency     PAST SURGICAL HISTORY: Past Surgical History:  Procedure Laterality Date  . COLONOSCOPY  2015   Dr. Charna Elizabeth  . LAPAROSCOPY FOR ECTOPIC PREGNANCY     Salpingectomy  . SALPINGECTOMY     R/T ectopic pregnancy    FAMILY HISTORY: The patient family history includes Aneurysm in her paternal grandmother; Arthritis in her brother; Breast cancer in her maternal aunt; Cancer in her maternal aunt; Diabetes in her mother; Heart disease in her paternal grandfather; Hypertension in her mother; Stroke in her maternal grandmother.  SOCIAL HISTORY:  The patient  reports that she has quit smoking. She has never used smokeless tobacco. She reports current alcohol use. She reports previous drug use. Frequency: 1.00 time per week. Drug: Marijuana.  REVIEW OF SYSTEMS: Review of Systems  Constitutional: Negative for chills and fever.  HENT: Negative for hoarse voice and nosebleeds.   Eyes: Negative for discharge, double vision and pain.  Cardiovascular: Negative for chest pain, claudication, dyspnea on exertion, leg swelling, near-syncope, orthopnea, palpitations, paroxysmal nocturnal dyspnea and syncope.  Respiratory: Negative for hemoptysis and shortness of breath.   Musculoskeletal: Negative for muscle cramps and myalgias.  Gastrointestinal: Negative for abdominal pain, constipation, diarrhea, hematemesis, hematochezia, melena, nausea and vomiting.  Neurological: Negative for  dizziness and light-headedness.    PHYSICAL EXAM: Vitals with BMI 02/05/2021 02/05/2021 02/03/2021  Height 5\' 5"  5\' 5"  5\' 5"   Weight 243 lbs 244 lbs 3 oz 243 lbs 6 oz  BMI 40.44 40.64 40.5  Systolic 126 130  Diastolic 73 84 84  Pulse 84 88 70    CONSTITUTIONAL: Well-developed and well-nourished. No acute distress.  SKIN: Skin is warm  and dry. No rash noted. No cyanosis. No pallor. No jaundice HEAD: Normocephalic and atraumatic.  EYES: No scleral icterus MOUTH/THROAT: Moist oral membranes.  NECK: No JVD present. No thyromegaly noted. No carotid bruits  LYMPHATIC: No visible cervical adenopathy.  CHEST Normal respiratory effort. No intercostal retractions  LUNGS: Clear to auscultation bilaterally. No stridor. No wheezes. No rales.  CARDIOVASCULAR: Regular rate and rhythm, positive S1-S2, no murmurs rubs or gallops appreciated. ABDOMINAL: Obese, soft, nontender, nondistended, positive bowel sounds all 4 quadrants. No apparent ascites.  EXTREMITIES: No peripheral edema  HEMATOLOGIC: No significant bruising NEUROLOGIC: Oriented to person, place, and time. Nonfocal. Normal muscle tone.  PSYCHIATRIC: Normal mood and affect. Normal behavior. Cooperative  CARDIAC DATABASE: EKG: 11/21/2020, normal sinus rhythm, 79 bpm, normal axis, poor R wave progression, without underlying ischemia or injury pattern.  Echocardiogram: 12/08/2020: Left ventricle cavity is normal in size and wall thickness. Normal global wall motion. Normal LV systolic function with visual EF 55-60%. Normal diastolic filling pattern.  No significant valvular regurgitation.  No evidence of pulmonary hypertension..   Stress Testing: Treadmill Exercise Stress 12/08/2020: Stress EKG is normal. The patient exercised for 6 minutes and 0 seconds on Bruce protocol; achieved 7.05 METs at 87% of maximum predicted heart rate. Low exercise tolerance. Chest pain is not present.  The heart rate response was normal.  The blood pressure response was normal.  Heart Catheterization: None  LABORATORY DATA: CBC Latest Ref Rng & Units 10/29/2020 05/05/2009 08/16/2007  WBC 3.4 - 10.8 x10E3/uL 6.3 4.9 5.7  Hemoglobin 11.1 - 15.9 g/dL 10/31/2020 05/07/2009 10/16/2007  Hematocrit 34.0 - 46.6 % 40.1 39.5 39.2  Platelets 150 - 450 x10E3/uL 278 263 265    CMP Latest Ref Rng & Units 10/29/2020  05/05/2009 08/13/2007  Glucose 65 - 99 mg/dL 89 87 -  BUN 6 - 24 mg/dL 8 8 10   Creatinine 0.57 - 1.00 mg/dL 10/31/2020 05/07/2009 08/15/2007  Sodium 134 - 144 mmol/L 137 137 -  Potassium 3.5 - 5.2 mmol/L 4.1 4.2 -  Chloride 96 - 106 mmol/L 100 105 -  CO2 20 - 29 mmol/L 20 25 -  Calcium 8.7 - 10.2 mg/dL 8.8 8.7 -  Total Protein 6.0 - 8.5 g/dL 7.0 7.1 -  Total Bilirubin 0.0 - 1.2 mg/dL 0.2 0.7 -  Alkaline Phos 44 - 121 IU/L 85 79 -  AST 0 - 40 IU/L 10 20 18   ALT 0 - 32 IU/L 8 19 -    Lipid Panel     Component Value Date/Time   CHOL 206 (H) 02/03/2021 0957   TRIG 129 02/03/2021 0957   HDL 44 02/03/2021 0957   CHOLHDL 4.7 (H) 02/03/2021 0957   LDLCALC 139 (H) 02/03/2021 0957   LABVLDL 23 02/03/2021 0957    No components found for: NTPROBNP No results for input(s): PROBNP in the last 8760 hours. Recent Labs    10/29/20 1538  TSH 1.970    BMP Recent Labs    10/29/20 1538  NA 137  K 4.1  CL 100  CO2 20  GLUCOSE 89  BUN 8  CREATININE 0.80  CALCIUM 8.8  GFRNONAA 91  GFRAA 105    HEMOGLOBIN A1C No results found for: HGBA1C, MPG  IMPRESSION:    ICD-10-CM   1. Precordial pain  R07.2   2. Former smoker  Z87.891   3. Class 3 severe obesity due to excess calories without serious comorbidity with body mass index (BMI) of 40.0 to 44.9 in adult (HCC)  E66.01    Z68.41   4. Encounter to discuss test results  Z71.2      RECOMMENDATIONS: Veronica Contreras is a 45 y.o. female whose past medical history and cardiac risk factors include: Intermittent cigarette smoking, obesity due to excess calories.  Precordial pain:  Resolved.  Echocardiogram and GXT results reviewed with the patient.  Patient no longer has symptoms of chest discomfort.  Most recent lipid profile independently reviewed with her during today's encounter.  Her estimated 10-year risk of ASCVD is less than 5% therefore recommend lifestyle modifications to improve her modifiable cardiovascular risk factors.  Patient is  motivated to lose weight and improve her modifiable risk factors.  Since January 2022 patient is smoke-free.  She is congratulated on her efforts/success.   Former smoking:  Patient states that she was very encouraged based on the tobacco cessation counseling provided to her at last office visit.  Patient states that she stopped smoking since January 2022 and is very thankful for the time spent educating her on the ill effects of nicotine use.    Educated on the importance of continued smoking cessation.  Obesity, due to excess calories: Body mass index is 40.44 kg/m. . I reviewed with the patient the importance of diet, regular physical activity/exercise, weight loss.   . Patient is educated on increasing physical activity gradually as tolerated.  With the goal of moderate intensity exercise for 30 minutes a day 5 days a week.  FINAL MEDICATION LIST END OF ENCOUNTER: No orders of the defined types were placed in this encounter.   There are no discontinued medications.   Current Outpatient Medications:  .  b complex vitamins capsule, Take 1 capsule by mouth daily., Disp: 90 capsule, Rfl: 0 .  cholecalciferol (VITAMIN D3) 25 MCG (1000 UNIT) tablet, Take 1 tablet (1,000 Units total) by mouth daily., Disp: 90 tablet, Rfl: 3 .  COSENTYX SENSOREADY, 300 MG, 150 MG/ML SOAJ, Inject into the skin., Disp: , Rfl:  .  meloxicam (MOBIC) 15 MG tablet, Take 15 mg by mouth daily., Disp: , Rfl:   No orders of the defined types were placed in this encounter.   There are no Patient Instructions on file for this visit.   --Continue cardiac medications as reconciled in final medication list. --Return in about 1 year (around 02/05/2022) for Follow up. Or sooner if needed. --Continue follow-up with your primary care physician regarding the management of your other chronic comorbid conditions.  Patient's questions and concerns were addressed to her satisfaction. She voices understanding of the  instructions provided during this encounter.   This note was created using a voice recognition software as a result there may be grammatical errors inadvertently enclosed that do not reflect the nature of this encounter. Every attempt is made to correct such errors.  Tessa Lerner, [place], [facility]  Pager: [phone] Office: [phone]

## 2021-02-05 NOTE — Progress Notes (Signed)
Subjective:  Veronica Contreras is a 45 y.o. female who presents for Chief Complaint  Patient presents with  . Gynecologic Exam    Pap and breast exam      Here for breast and Pap exam.  She was on her menstrual period last week when we saw her for physical.  She checks her breasts occasionally.  No recent concerns.    History of 1 ectopic pregnancy, no other prior pregnancy.  Periods are regular, not particularly heavy  She has questions about bariatric clinic referral.  She is struggled for years with her weight.  She has not had success keeping weight off or losing weight.  She has never tried any medication.  She is interested in bariatric surgery  No other aggravating or relieving factors.    No other c/o.  Past Medical History:  Diagnosis Date  . Acid reflux   . B12 deficiency 10/2020  . Ectopic pregnancy   . Foot fracture, right    prior  . Goiter   . Psoriasis    nails  . Vitamin D deficiency    Past Surgical History:  Procedure Laterality Date  . COLONOSCOPY  2015   Dr. Charna Elizabeth  . LAPAROSCOPY FOR ECTOPIC PREGNANCY     Salpingectomy  . SALPINGECTOMY     R/T ectopic pregnancy     The following portions of the patient's history were reviewed and updated as appropriate: allergies, current medications, past family history, past medical history, past social history, past surgical history and problem list.  ROS Otherwise as in subjective above  Objective: BP 130/84   Pulse 88   Ht 5\' 5"  (1.651 m)   Wt 244 lb 3.2 oz (110.8 kg)   LMP 02/01/2021 (Exact Date)   SpO2 96%   BMI 40.64 kg/m   General appearance: alert, no distress, well developed, well nourished  Breast: nontender, no masses or lumps, no skin changes, no nipple discharge or inversion, no axillary lymphadenopathy, tattoo noted of right upper breast of a butterfly Gyn: Normal external genitalia without lesions, vagina with normal mucosa, cervix with post menstrual bleeding, no other lesions, no  cervical motion tenderness, no abnormal vaginal discharge.  Uterus and adnexa not enlarged, nontender, no masses.  Pap performed.  Exam chaperoned by nurse. Rectal: Anus normal-appearing    Assessment: Encounter Diagnoses  Name Primary?  . Gastroesophageal reflux disease, unspecified whether esophagitis present Yes  . Goiter   . Encounter for screening mammogram for malignant neoplasm of breast   . Screening for cervical cancer   . BMI 40.0-44.9, adult (HCC)   . B12 deficiency   . Sleep disturbance   . Snoring      Plan: Obesity, GERD, sleep disturbance, snoring, risk for sleep apnea-referral to bariatric clinic   Screening for breast cancer-she will schedule mammogram, discussed routine self breast exams, yearly clinical breast exam  Pap smear sent today with HPV screen    Veronica Contreras was seen today for gynecologic exam.  Diagnoses and all orders for this visit:  Gastroesophageal reflux disease, unspecified whether esophagitis present -     Amb Referral to Bariatric Surgery  Goiter -     Amb Referral to Bariatric Surgery  Encounter for screening mammogram for malignant neoplasm of breast -     MM DIGITAL SCREENING BILATERAL; Future  Screening for cervical cancer -     Cytology - PAP([place])  BMI 40.0-44.9, adult (HCC) -     Amb Referral to Bariatric Surgery  B12  deficiency  Sleep disturbance -     Amb Referral to Bariatric Surgery  Snoring -     Amb Referral to Bariatric Surgery    Follow up: pending studies

## 2021-02-05 NOTE — Patient Instructions (Signed)
Please call to schedule your mammogram.   [facility]  [phone] [address], [address] [place], [place] [address]  

## 2021-02-06 LAB — CYTOLOGY - PAP
Chlamydia: NEGATIVE
Comment: NEGATIVE
Comment: NEGATIVE
Comment: NORMAL
Diagnosis: NEGATIVE
High risk HPV: NEGATIVE
Neisseria Gonorrhea: NEGATIVE

## 2021-02-17 ENCOUNTER — Ambulatory Visit: Payer: No Typology Code available for payment source | Admitting: General Surgery

## 2021-03-10 ENCOUNTER — Encounter: Payer: Self-pay | Admitting: General Surgery

## 2021-03-10 ENCOUNTER — Ambulatory Visit (INDEPENDENT_AMBULATORY_CARE_PROVIDER_SITE_OTHER): Payer: No Typology Code available for payment source | Admitting: General Surgery

## 2021-03-10 ENCOUNTER — Other Ambulatory Visit: Payer: Self-pay

## 2021-03-10 VITALS — BP 148/86 | HR 68 | Temp 98.3°F | Ht 65.25 in | Wt 240.0 lb

## 2021-03-10 DIAGNOSIS — E042 Nontoxic multinodular goiter: Secondary | ICD-10-CM | POA: Diagnosis not present

## 2021-03-10 MED ORDER — PANTOPRAZOLE SODIUM 40 MG PO TBEC
40.0000 mg | DELAYED_RELEASE_TABLET | Freq: Every day | ORAL | 1 refills | Status: DC
Start: 2021-03-10 — End: 2022-02-04

## 2021-03-10 NOTE — Progress Notes (Signed)
Patient ID: [name], female   DOB: 16-Jan-1976, 45 y.o.   MRN: [id number]  Chief Complaint  Patient presents with  . New Patient (Initial Visit)    Goiter     HPI Veronica Contreras is a 45 y.o. female.   She has been referred by her primary care provider for further evaluation of a multinodular goiter.  Ms. [name] states that she has had a multinodular goiter for a very long time.  She had a biopsy performed of a right-sided nodule in 2014, which was benign.  A left-sided nodule was biopsied in 2019, and was also benign.  She has had fairly regular ultrasound imaging for surveillance.  The most recent of these was done on February 05, 2021 and demonstrated stability from prior imaging.  Her TSH was within normal limits in December and she is not taking any thyroid hormone replacement.  Today, she states that she does not think she is having any symptoms from her thyroid.  She denies any heart palpitations or hand tremors.  No changes in the texture of her hair, skin, or fingernails, aside from some psoriatic involvement of her nails which is responding well to treatment.  She states that she is perimenopausal and has recently become somewhat heat intolerant as a result.  She denies any significant unexplained weight loss or weight gain.  She does endorse a globus sensation and reports acid reflux that is worsened by specific dietary intake.  No dysphagia, frequent throat clearing, or pressure sensation while in the supine position.  No occupational or therapeutic measures to ionizing radiation.  She says that her mother sister and a cousin both have had thyroid trouble, but she is not sure what it is specifically.  No known history of thyroid cancer.   Past Medical History:  Diagnosis Date  . Acid reflux   . B12 deficiency 10/2020  . Ectopic pregnancy   . Foot fracture, right    prior  . Goiter   . Psoriasis    nails  . Vitamin D deficiency     Past Surgical History:  Procedure  Laterality Date  . COLONOSCOPY  2015   Dr. Charna Elizabeth  . LAPAROSCOPY FOR ECTOPIC PREGNANCY     Salpingectomy  . SALPINGECTOMY     R/T ectopic pregnancy    Family History  Problem Relation Age of Onset  . Hypertension Mother   . Diabetes Mother        complication of medication  . Breast cancer Maternal Aunt   . Thyroid disease Maternal Aunt   . Arthritis Brother   . Stroke Maternal Grandmother   . Aneurysm Paternal Grandmother   . Heart disease Paternal Grandfather        CABG  . Thyroid disease Cousin     Social History Social History   Tobacco Use  . Smoking status: Former Games developer  . Smokeless tobacco: Never Used  Vaping Use  . Vaping Use: Never used  Substance Use Topics  . Alcohol use: Yes    Comment: some, social  . Drug use: Not Currently    Frequency: 1.0 times per week    Types: Marijuana    Comment: none since 09/2014    Allergies  Allergen Reactions  . Vicodin [Hydrocodone-Acetaminophen] Nausea And Vomiting    Current Outpatient Medications  Medication Sig Dispense Refill  . b complex vitamins capsule Take 1 capsule by mouth daily. 90 capsule 0  . cholecalciferol (VITAMIN D3) 25 MCG (1000 UNIT) tablet  Take 1 tablet (1,000 Units total) by mouth daily. 90 tablet 3  . COSENTYX SENSOREADY, 300 MG, 150 MG/ML SOAJ Inject into the skin.    . meloxicam (MOBIC) 15 MG tablet Take 15 mg by mouth daily.    . pantoprazole (PROTONIX) 40 MG tablet Take 1 tablet (40 mg total) by mouth daily. 30 tablet 1   No current facility-administered medications for this visit.    Review of Systems Review of Systems  All other systems reviewed and are negative. Or as discussed in the history of present illness.  Blood pressure (!) 148/86, pulse 68, temperature 98.3 F (36.8 C), height 5' 5.25" (1.657 m), weight 240 lb (108.9 kg), last menstrual period 03/02/2021, SpO2 96 %. Body mass index is 39.63 kg/m.  Physical Exam Physical Exam Constitutional:      General: She  is not in acute distress.    Appearance: She is obese.  HENT:     Head: Normocephalic and atraumatic.     Nose:     Comments: Covered with a mask    Mouth/Throat:     Comments: Covered with a mask Eyes:     General: No scleral icterus.       Right eye: No discharge.        Left eye: No discharge.     Comments: No proptosis or exophthalmos.  No lid lag or stare.  Neck:     Comments: No palpable cervical or supraclavicular lymphadenopathy.  The trachea is midline.  The thyroid is enlarged bilaterally, left greater than right.  On palpation, there is a roughly 3 cm left-sided thyroid nodule.  The gland moves freely with deglutition. Cardiovascular:     Rate and Rhythm: Normal rate and regular rhythm.     Pulses: Normal pulses.  Pulmonary:     Effort: Pulmonary effort is normal.     Breath sounds: Normal breath sounds.  Abdominal:     General: Bowel sounds are normal.     Palpations: Abdomen is soft.     Comments: Protuberant, consistent with her level of obesity.  Genitourinary:    Comments: Deferred Musculoskeletal:        General: No swelling or tenderness.     Comments: No pretibial dermopathy.  Skin:    General: Skin is warm and dry.  Neurological:     General: No focal deficit present.     Mental Status: She is alert and oriented to person, place, and time.  Psychiatric:        Mood and Affect: Mood normal.        Behavior: Behavior normal.     Data Reviewed Results for Carter KittenMORROW, Veronica Contreras (MRN [id number]) as of 03/10/2021 16:09  Ref. Range 10/29/2020 15:38  TSH Latest Ref Range: 0.450 - 4.500 uIU/mL 1.970  This is within normal limits  I personally reviewed multiple thyroid ultrasound imaging studies and concur with the radiology assessments as documented in the electronic medical record.  I have copied the report of the most recent here:  CLINICAL DATA:  Goiter.  EXAM: THYROID ULTRASOUND  TECHNIQUE: Ultrasound examination of the thyroid gland and adjacent  soft tissues was performed.  COMPARISON:  Ultrasound thyroid 07/25/2019  Ultrasound FNA thyroid 02/02/2018  Ultrasound thyroid 12/21/2017  FINDINGS: Parenchymal Echotexture: Mildly heterogeneous  Isthmus: 1.5 cm  Right lobe: 7.8 x 3.4 x 3.8 cm  Left lobe: 7.5 x 3.1 x 3.4 cm  _________________________________________________________  Estimated total number of nodules >/= 1 cm: 3  Number of  spongiform nodules >/=  2 cm not described below (TR1): 0  Number of mixed cystic and solid nodules >/= 1.5 cm not described below (TR2): 0  _________________________________________________________  Nodule 1: 4.5 x 4.5 x 2.5 cm solid nodule in the inferior right thyroid lobe is not significant changed from prior exam. Prior FNA of this nodule was performed on 05/30/2013. Please correlate with biopsy results.  _________________________________________________________  Nodule 2: Spongiform subcentimeter left thyroid nodule does not meet criteria for FNA or imaging follow-up.  _________________________________________________________  Nodule # 3:  Prior biopsy: No  Location: Left; superior  Maximum size: 1.0 cm; Other 2 dimensions: 0.7 x 0.5 cm, previously, 1.0 x 0.9 x 0.5 cm on 12/21/2017  Composition: solid/almost completely solid (2)  Echogenicity: hypoechoic (2)  Shape: not taller-than-wide (0)  Margins: smooth (0)  Echogenic foci: none (0)  ACR TI-RADS total points: 4.  ACR TI-RADS risk category:  TR4 (4-6 points).  Significant change in size (>/= 20% in two dimensions and minimal increase of 2 mm): No  Change in features: No  Change in ACR TI-RADS risk category: No  ACR TI-RADS recommendations:  *Given size (>/= 1 - 1.4 cm) and appearance, a follow-up ultrasound in 1 year should be considered based on TI-RADS criteria.  _________________________________________________________  Nodule 4: 2.8 x 2.3 x 2.0 cm  predominantly solid hypoechoic nodule in the inferior left thyroid lobe is not significantly changed in size since prior examination. This nodule was previously biopsied on 02/02/2018. Please correlate with results.  IMPRESSION: 1. Nodule 1 and 4 located in the inferior right and inferior left thyroid lobe respectively, were previously biopsied. Please correlate with results. These nodules are not significant changed in size since prior exam. 2. Nodule 3 (TI-RADS 4) measuring 1.0 x 0.7 x 0.5 cm is not significantly changed in size dating back to 12/21/2017. It was not seen on exams older than this date. This nodule meets criteria for imaging follow-up. Next ultrasound should be performed in 1 year.  I also reviewed the cytopathology reports from May 30, 2013 and June 04, 2018.  Both biopsies were benign.  Assessment This is a 45 year old woman with a multinodular goiter.  Imaging studies have been stable over several years and she has had biopsies of the appropriate nodules on both the right and the left.  She does have another nodule that meets criteria for imaging follow-up, which should be performed in 1 year.  She does endorse a foreign body sensation from time to time, but states that this is not consistent.  She has no other symptoms attributable to her goiter.  As she does have acid reflux, I suspect that the globus is secondary to this.  I discussed some recommendations regarding the avoidance of specific foods, such as spicy foods, tomato-based foods, peppermint, chocolate, caffeine, and others.  I also recommended that she elevate the head of her bed at night.  Protonix 40 mg daily was prescribed to see if this helps.  I do not think she requires thyroid surgery.  Plan I have recommended that she continue to participate in annual surveillance imaging of her multinodular goiter.  This is to monitor for any change in the dominant nodules that have previously been biopsied, as well as  monitor the TI-RADS 4 nodule, labeled nodule 3 and the most recent study.  If repeat biopsy is indicated due to changes in the nodules or if the nodule 3 develops features that would require biopsy, I am certainly happy to perform  that in my office versus having them performed in radiology.  The patient did mention that she is seeking bariatric surgery consultation.  I mentioned that this could also help with her reflux symptoms, if she chooses to pursue this route.  For now, I will see her on an as-needed basis.    Duanne Guess 03/10/2021, 2:22 PM

## 2021-03-10 NOTE — Patient Instructions (Addendum)
Continue to monitor your thyroid and have your follow up ultrasounds.  We have sent in a prescription for Prilosec. If this works for you you may get your refills through your primary care provider.   Food Choices for Gastroesophageal Reflux Disease, Adult When you have gastroesophageal reflux disease (GERD), the foods you eat and your eating habits are very important. Choosing the right foods can help ease your discomfort. Think about working with a food expert (dietitian) to help you make good choices. What are tips for following this plan? Reading food labels  Look for foods that are low in saturated fat. Foods that may help with your symptoms include: ? Foods that have less than 5% of daily value (DV) of fat. ? Foods that have 0 grams of trans fat. Cooking  Do not fry your food.  Cook your food by baking, steaming, grilling, or broiling. These are all methods that do not need a lot of fat for cooking.  To add flavor, try to use herbs that are low in spice and acidity. Meal planning  Choose healthy foods that are low in fat, such as: ? Fruits and vegetables. ? Whole grains. ? Low-fat dairy products. ? Lean meats, fish, and poultry.  Eat small meals often instead of eating 3 large meals each day. Eat your meals slowly in a place where you are relaxed. Avoid bending over or lying down until 2-3 hours after eating.  Limit high-fat foods such as fatty meats or fried foods.  Limit your intake of fatty foods, such as oils, butter, and shortening.  Avoid the following as told by your doctor: ? Foods that cause symptoms. These may be different for different people. Keep a food diary to keep track of foods that cause symptoms. ? Alcohol. ? Drinking a lot of liquid with meals. ? Eating meals during the 2-3 hours before bed.   Lifestyle  Stay at a healthy weight. Ask your doctor what weight is healthy for you. If you need to lose weight, work with your doctor to do so  safely.  Exercise for at least 30 minutes on 5 or more days each week, or as told by your doctor.  Wear loose-fitting clothes.  Do not smoke or use any products that contain nicotine or tobacco. If you need help quitting, ask your doctor.  Sleep with the head of your bed higher than your feet. Use a wedge under the mattress or blocks under the bed frame to raise the head of the bed.  Chew sugar-free gum after meals. What foods should eat? Eat a healthy, well-balanced diet of fruits, vegetables, whole grains, low-fat dairy products, lean meats, fish, and poultry. Each person is different. Foods that may cause symptoms in one person may not cause any symptoms in another person. Work with your doctor to find foods that are safe for you. The items listed above may not be a complete list of what you can eat and drink. Contact a food expert for more options.   What foods should I avoid? Limiting some of these foods may help in managing the symptoms of GERD. Everyone is different. Talk with a food expert or your doctor to help you find the exact foods to avoid, if any. Fruits Any fruits prepared with added fat. Any fruits that cause symptoms. For some people, this may include citrus fruits, such as oranges, grapefruit, pineapple, and lemons. Vegetables Deep-fried vegetables. [place] fries. Any vegetables prepared with added fat. Any vegetables that cause symptoms.  For some people, this may include tomatoes and tomato products, chili peppers, onions and garlic, and horseradish. Grains Pastries or quick breads with added fat. Meats and other proteins High-fat meats, such as fatty beef or pork, hot dogs, ribs, ham, sausage, salami, and bacon. Fried meat or protein, including fried fish and fried chicken. Nuts and nut butters, in large amounts. Dairy Whole milk and chocolate milk. Sour cream. Cream. Ice cream. Cream cheese. Milkshakes. Fats and oils Butter. Margarine. Shortening.  Ghee. Beverages Coffee and tea, with or without caffeine. Carbonated beverages. Sodas. Energy drinks. Fruit juice made with acidic fruits, such as orange or grapefruit. Tomato juice. Alcoholic drinks. Sweets and desserts Chocolate and cocoa. Donuts. Seasonings and condiments Pepper. Peppermint and spearmint. Added salt. Any condiments, herbs, or seasonings that cause symptoms. For some people, this may include curry, hot sauce, or vinegar-based salad dressings. The items listed above may not be a complete list of what you should not eat and drink. Contact a food expert for more options. Questions to ask your doctor Diet and lifestyle changes are often the first steps that are taken to manage symptoms of GERD. If diet and lifestyle changes do not help, talk with your doctor about taking medicines. Where to find more information  International [employer] Gastrointestinal Disorders: aboutgerd.org Summary  When you have GERD, food and lifestyle choices are very important in easing your symptoms.  Eat small meals often instead of 3 large meals a day. Eat your meals slowly and in a place where you are relaxed.  Avoid bending over or lying down until 2-3 hours after eating.  Limit high-fat foods such as fatty meats or fried foods. This information is not intended to replace advice given to you by your health care provider. Make sure you discuss any questions you have with your health care provider. Document Revised: 05/12/2020 Document Reviewed: 05/12/2020 Elsevier Patient Education  2021 [employer] Inc.   Goiter  A goiter is an enlarged thyroid gland. The thyroid is located in the lower front of the neck. It makes hormones that affect many body parts and systems, including the system that affects how quickly the body burns fuel for energy (metabolism). Most goiters are painless and are not a cause for concern. Some goiters can affect the way your thyroid makes thyroid hormones. Goiters and  conditions that cause goiters can be treated, if necessary. What are the causes? Common causes of this condition include:  Lack (deficiency) of a mineral called iodine. The thyroid gland uses iodine to make thyroid hormones.  Diseases that attack healthy cells in the body (autoimmune diseases) and affect thyroid function, such as Graves' disease or Hashimoto's disease. These diseases may cause the body to produce too much thyroid hormone (hyperthyroidism) or too little of the hormone (hypothyroidism).  Conditions that cause inflammation of the thyroid (thyroiditis).  One or more small growths on the thyroid (nodular goiter). Other causes include:  Medical problems caused by abnormal genes that are passed from parent to child (genetic defects).  Thyroid injury or infection.  Tumors that may or may not be cancerous.  Pregnancy.  Certain medicines.  Exposure to radiation. In some cases, the cause may not be known. What increases the risk? This condition is more likely to develop in:  People who do not get enough iodine in their diet.  People who have a family history of goiter.  Women.  People who are older than age 5.  People who smoke tobacco.  People who have  had exposure to radiation. What are the signs or symptoms? The main symptom of this condition is swelling in the lower, front part of the neck. This swelling can range from a very small bump to a large lump. Other symptoms may include:  A tight feeling in the throat.  A hoarse voice.  Coughing.  Wheezing.  Difficulty swallowing or breathing.  Bulging veins in the neck.  Dizziness. When a goiter is the result of an overactive thyroid (hyperthyroidism), symptoms may also include:  Nervousness or restlessness.  Inability to tolerate heat.  Unexplained weight loss.  Diarrhea.  Change in the texture of hair or skin.  Changes in heartbeat, such as skipped beats, extra beats, or a rapid heart  rate.  Loss of menstruation.  Shaky hands.  Increased appetite.  Sleep problems. When a goiter is the result of an underactive thyroid (hypothyroidism), symptoms may also include:  Feeling like you have no energy (lethargy).  Inability to tolerate cold.  Weight gain that is not explained by a change in diet or exercise habits.  Dry skin.  Coarse hair.  Irregular menstrual periods.  Constipation.  Sadness or depression.  Fatigue. In some cases, there may not be any symptoms and the thyroid hormone levels may be normal.   How is this diagnosed? This condition may be diagnosed based on your symptoms, your medical history, and a physical exam. You may have tests, such as:  Blood tests to check thyroid function.  Imaging tests, such as: ? Ultrasound. ? CT scan. ? MRI. ? Thyroid scan.  Removal of a tissue sample (biopsy) of the goiter or any nodules. The sample will be tested to check for cancer. How is this treated? Treatment for this condition depends on the cause and your symptoms. Treatment may include:  Medicines to regulate thyroid hormone levels.  Anti-inflammatory medicines or steroid medicines, if the goiter is caused by inflammation.  Iodine supplements or changes to your diet, if the goiter is caused by iodine deficiency.  Radioactive iodine treatment.  Surgery to remove your thyroid. In some cases, you may only need regular check-ups with your health care provider to monitor your condition, and you may not need treatment. Follow these instructions at home:  Follow instructions from your health care provider about any changes to your diet.  Take over-the-counter and prescription medicines only as told by your health care provider. These include supplements.  Do not use any products that contain nicotine or tobacco, such as cigarettes and e-cigarettes. If you need help quitting, ask your health care provider.  Keep all follow-up visits as told by your  health care provider. This is important. Contact a health care provider if:  Your symptoms do not get better with treatment.  You have nausea, vomiting, or diarrhea. Get help right away if:  You have sudden, unexplained confusion or other mental changes.  You have a fever.  You have chest pain.  You have trouble breathing or swallowing.  You suddenly become very weak.  You experience extreme restlessness.  You feel your heart racing. Summary  A goiter is an enlarged thyroid gland.  The thyroid gland is located in the lower front of the neck. It makes hormones that affect many body parts and systems, including the system that affects how quickly the body burns fuel for energy (metabolism).  The main symptom of this condition is swelling in the lower, front part of the neck. This swelling can range from a very small bump to a  large lump.  Treatment for this condition depends on the cause and your symptoms. You may need medicines, supplements, or regular monitoring of your condition. This information is not intended to replace advice given to you by your health care provider. Make sure you discuss any questions you have with your health care provider. Document Revised: 07/10/2020 Document Reviewed: 07/10/2020 Elsevier Patient Education  2021 [employer].

## 2021-04-02 ENCOUNTER — Other Ambulatory Visit: Payer: Self-pay | Admitting: Medical

## 2021-04-02 DIAGNOSIS — Z1231 Encounter for screening mammogram for malignant neoplasm of breast: Secondary | ICD-10-CM

## 2021-04-14 IMAGING — US US THYROID
1 series · 13 of 25 positions shown · non-contrast
Comparison: 02/02/2018, 12/21/2017

CLINICAL DATA: Thyroid nodules, previous biopsy dominant right
inferior nodule 05/30/2013 and previous biopsy left inferior
dominant nodule 02/02/2018

EXAM:
THYROID ULTRASOUND
TECHNIQUE: Ultrasound examination of the thyroid gland and adjacent soft
tissues was performed.

[Series 1: us thyroid · 0.06mm/px · 13 of 51 slices shown]
[im 1/51]
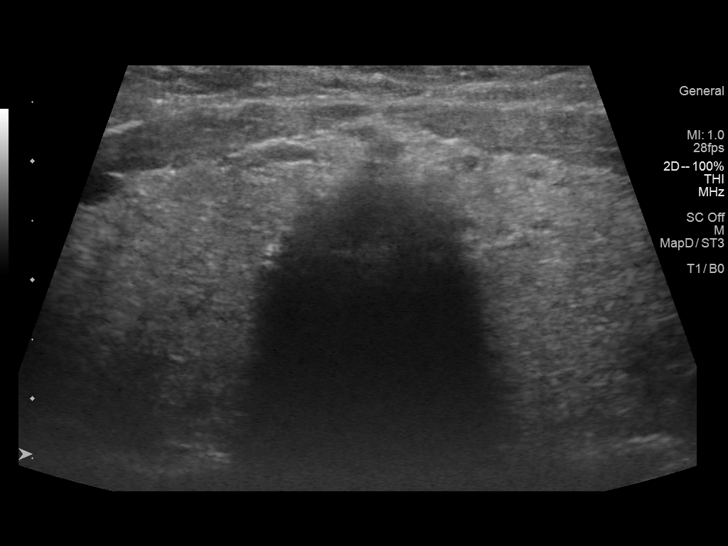
[im 5/51]
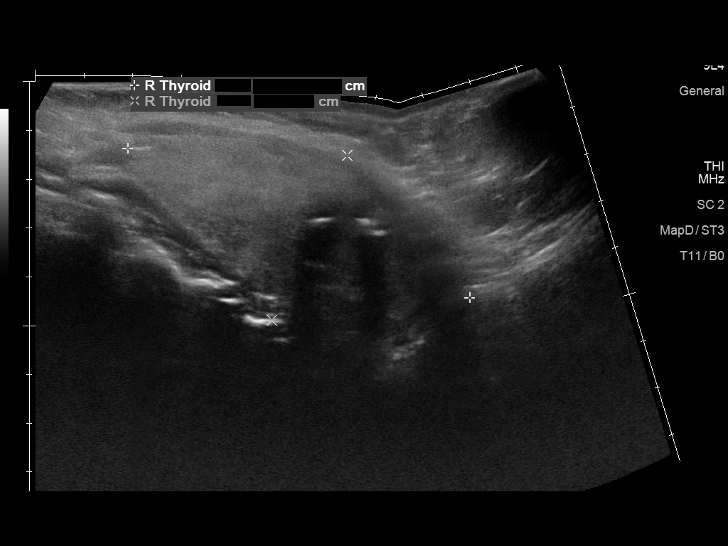
[im 9/51]
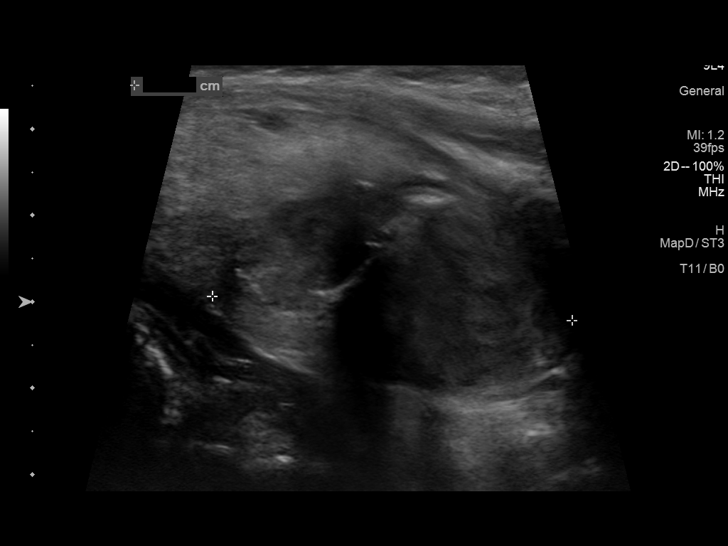
[im 13/51]
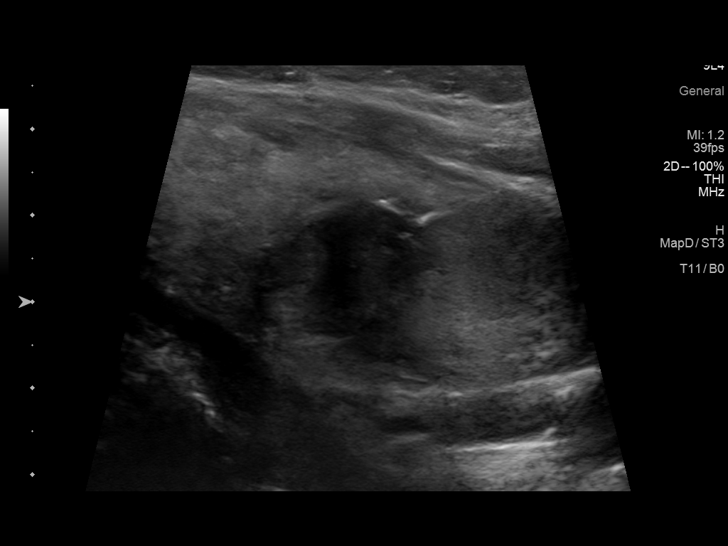
[im 17/51]
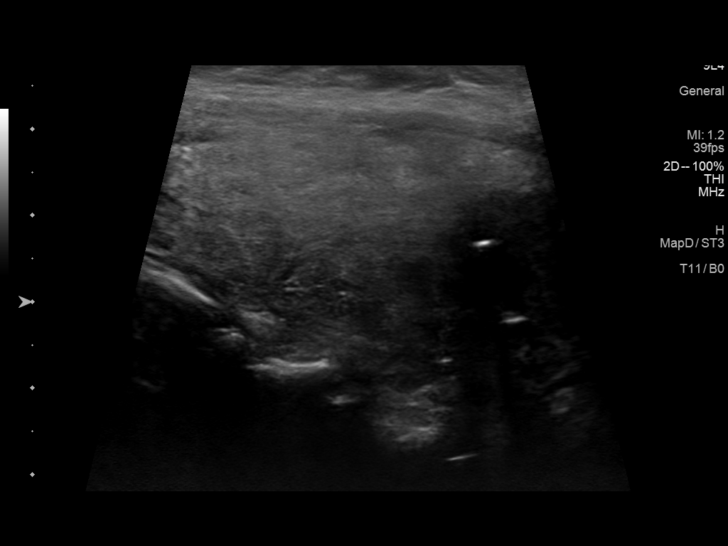
[im 21/51]
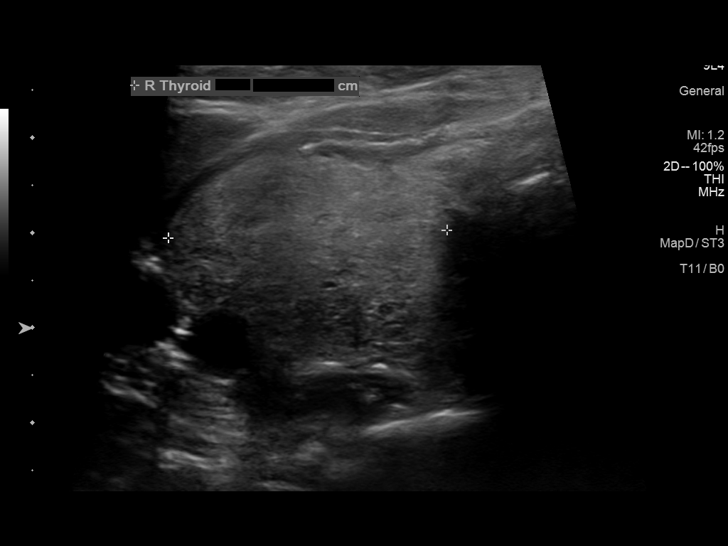
[im 26/51]
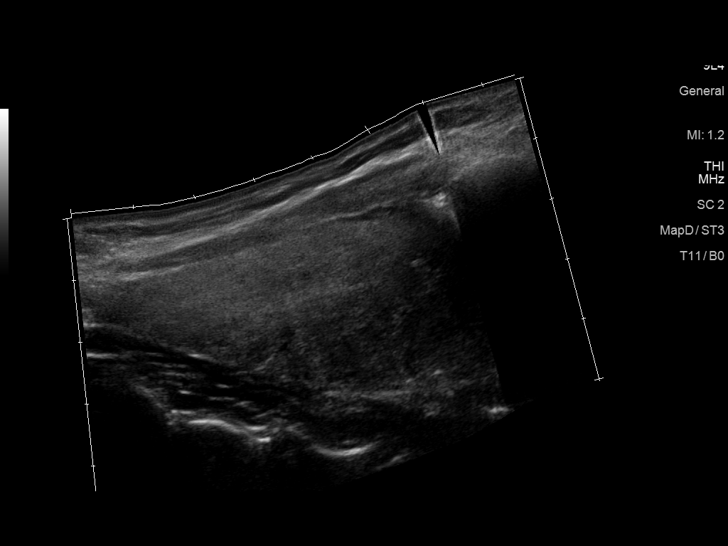
[im 30/51]
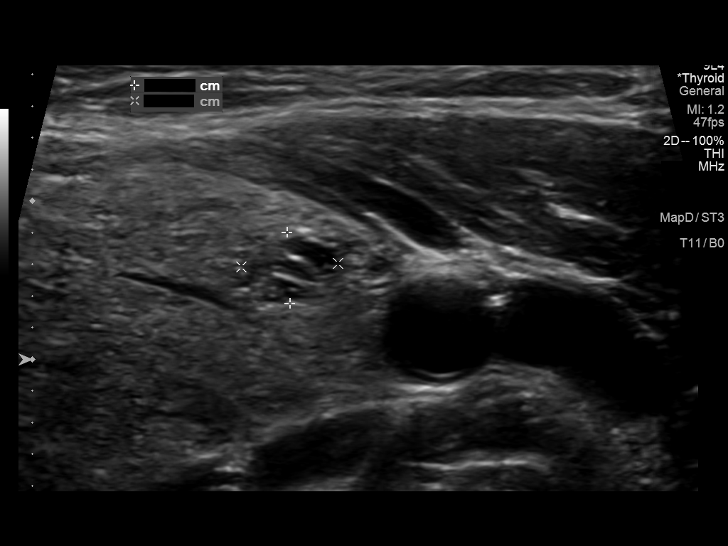
[im 34/51]
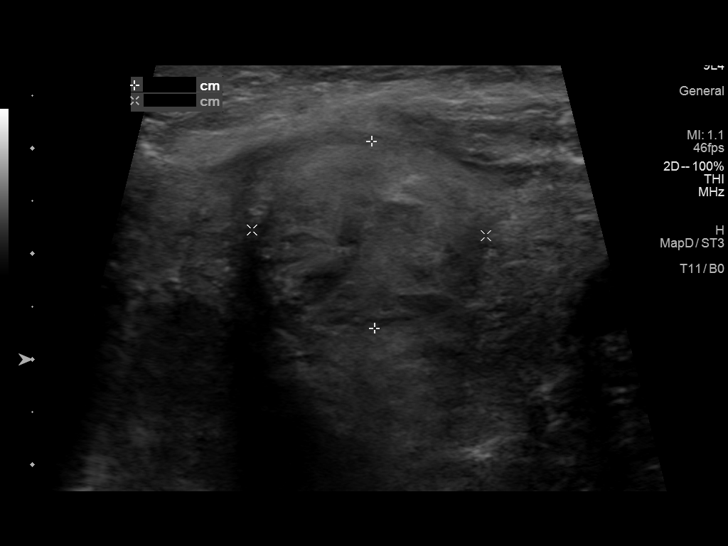
[im 38/51]
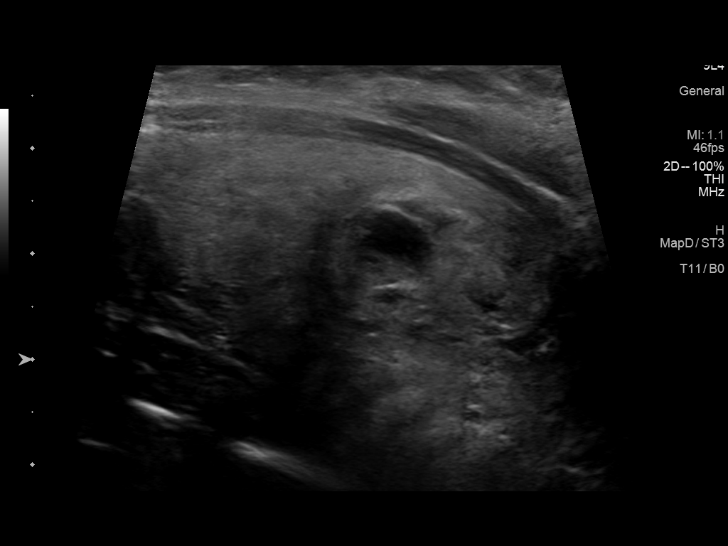
[im 42/51]
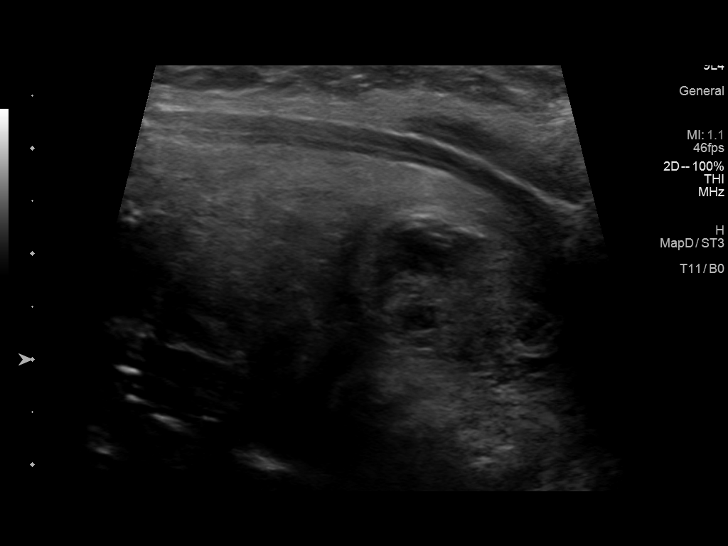
[im 46/51]
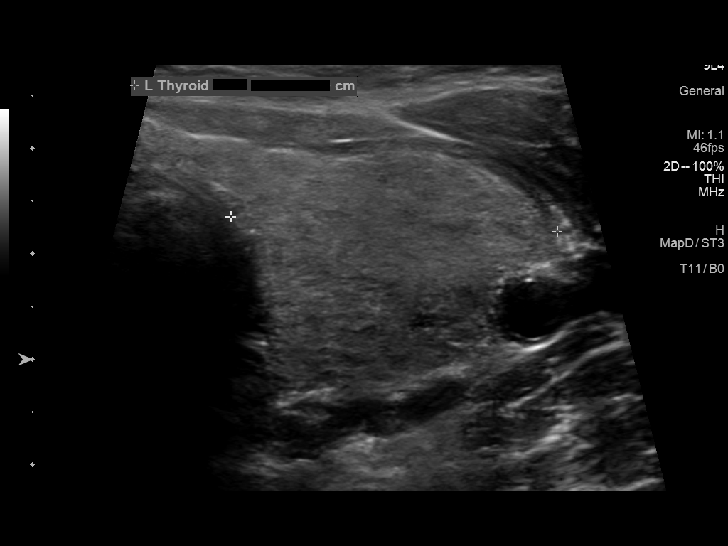
[im 51/51]
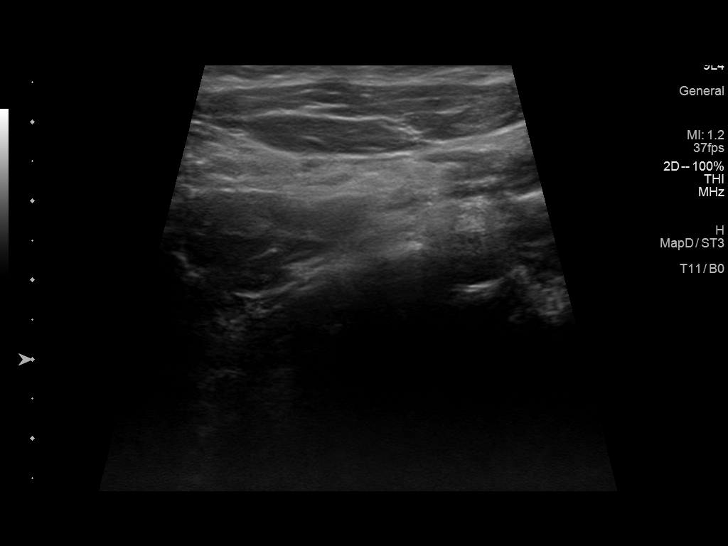

[13 of 25 positions shown; findings below may reference images not displayed]

FINDINGS: Parenchymal Echotexture: Mildly heterogenous

Isthmus: 1 cm

Right lobe: 7.6 x 3.7 x 2.9 cm

Left lobe: 6.7 x 3.1 x 3.1 cm

_________________________________________________________

Estimated total number of nodules >/= 1 cm: 3

Number of spongiform nodules >/=  2 cm not described below (TR1): 0

Number of mixed cystic and solid nodules >/= 1.5 cm not described
below (TR2): 0

_________________________________________________________

The previously biopsied right inferior solid hypoechoic nodule with
macrocalcification measures 4.2 x 3.9 x 2.3 cm, previously 4.1 x
x 2.2 cm. Correlate with prior pathology.

The previously biopsied left inferior TR 4 nodule is slightly larger
measuring 2.5 x 2.2 x 1.8 cm, previously 1.8 x 1.7 x 1.5 cm.
Correlate with prior pathology.

There are additional bilateral subcentimeter isoechoic and mixed
cystic/solid nodules noted all measuring 1 cm or less in size. These
would not meet criteria for any biopsy or follow-up.

No hypervascularity

No regional adenopathy
IMPRESSION: Similar thyromegaly with bilateral nodules as above.

Previously biopsied dominant inferior nodules bilaterally measure
slightly larger. This remains nonspecific. Correlate with prior
pathology.

The above is in keeping with the ACR TI-RADS recommendations - [HOSPITAL] 4388;[DATE].

## 2021-06-19 ENCOUNTER — Ambulatory Visit
Admission: RE | Admit: 2021-06-19 | Discharge: 2021-06-19 | Disposition: A | Discharge status: Home / Self Care | Payer: 59 | Source: Ambulatory Visit | Attending: Medical | Admitting: Medical

## 2021-06-19 ENCOUNTER — Other Ambulatory Visit: Payer: Self-pay

## 2021-06-19 DIAGNOSIS — Z1231 Encounter for screening mammogram for malignant neoplasm of breast: Secondary | ICD-10-CM | POA: Diagnosis not present

## 2021-07-27 DIAGNOSIS — Z79899 Other long term (current) drug therapy: Secondary | ICD-10-CM | POA: Diagnosis not present

## 2021-07-27 DIAGNOSIS — L400 Psoriasis vulgaris: Secondary | ICD-10-CM | POA: Diagnosis not present

## 2021-08-13 ENCOUNTER — Encounter: Payer: Self-pay | Admitting: General Surgery

## 2021-12-28 DIAGNOSIS — Z20822 Contact with and (suspected) exposure to covid-19: Secondary | ICD-10-CM | POA: Diagnosis not present

## 2021-12-28 DIAGNOSIS — R0789 Other chest pain: Secondary | ICD-10-CM | POA: Diagnosis not present

## 2021-12-28 DIAGNOSIS — R051 Acute cough: Secondary | ICD-10-CM | POA: Diagnosis not present

## 2022-02-04 ENCOUNTER — Other Ambulatory Visit: Payer: Self-pay

## 2022-02-04 ENCOUNTER — Ambulatory Visit: Payer: 59 | Admitting: Cardiology

## 2022-02-04 ENCOUNTER — Encounter: Payer: Self-pay | Admitting: Cardiology

## 2022-02-04 VITALS — BP 131/83 | HR 86 | Temp 97.2°F | Resp 16 | Ht 62.5 in | Wt 248.0 lb

## 2022-02-04 DIAGNOSIS — R072 Precordial pain: Secondary | ICD-10-CM

## 2022-02-04 DIAGNOSIS — Z87891 Personal history of nicotine dependence: Secondary | ICD-10-CM

## 2022-02-04 DIAGNOSIS — Z6841 Body Mass Index (BMI) 40.0 and over, adult: Secondary | ICD-10-CM | POA: Diagnosis not present

## 2022-02-04 NOTE — Progress Notes (Signed)
? ?Date:  02/04/2022  ? ?ID:  Veronica Contreras, DOB [birth year], MRN [id number] ? ?PCP:  Jac Canavan, PA-C  ?Cardiologist:  Tessa Lerner, DO, [facility] (established care 11/21/2020) ? ?Date: 02/04/22 ?Last Office Visit: 02/05/2021.  ? ?Chief Complaint  ?Patient presents with  ? Chest Pain  ? ? ?HPI  ?Veronica Contreras is a 46 y.o. female presents today for annual follow-up visit. ? ?She was referred to the practice for evaluation of chest pain.  Since then has undergone an echo and stress test results will be reviewed at today's office visit and noted below for further reference.  Over the last 1 year patient states that she has not had any reoccurrence of chest pain or heart failure symptoms.  She has successfully stopped smoking from for the last 1 year and 2 months for which she is congratulated for today's office visit.  Patient states that she is motivated to lose weight by implementing lifestyle changes. ? ?No recent labs available for review.  She is scheduled for a annual well visit with her PCP in May 2023. ? ?Her overall functional status remains relatively stable.  No structured exercise program or daily routine.  But is able to go up 3 flights of stairs routinely at home. ? ?FUNCTIONAL STATUS: ?No structured exercise program or daily routine.  ? ?ALLERGIES: ?Allergies  ?Allergen Reactions  ? Vicodin [Hydrocodone-Acetaminophen] Nausea And Vomiting  ? ? ?MEDICATION LIST PRIOR TO VISIT: ?Current Meds  ?Medication Sig  ? b complex vitamins capsule Take 1 capsule by mouth daily.  ? cholecalciferol (VITAMIN D3) 25 MCG (1000 UNIT) tablet Take 1 tablet (1,000 Units total) by mouth daily.  ? COSENTYX SENSOREADY, 300 MG, 150 MG/ML SOAJ Inject into the skin.  ?  ? ?PAST MEDICAL HISTORY: ?Past Medical History:  ?Diagnosis Date  ? Acid reflux   ? B12 deficiency 10/2020  ? Ectopic pregnancy   ? Foot fracture, right   ? prior  ? Goiter   ? Psoriasis   ? nails  ? Vitamin D deficiency   ? ? ?PAST SURGICAL HISTORY: ?Past  Surgical History:  ?Procedure Laterality Date  ? COLONOSCOPY  2015  ? Dr. Charna Elizabeth  ? LAPAROSCOPY FOR ECTOPIC PREGNANCY    ? Salpingectomy  ? SALPINGECTOMY    ? R/T ectopic pregnancy  ? ? ?FAMILY HISTORY: ?The patient family history includes Aneurysm in her paternal grandmother; Arthritis in her brother; Breast cancer in her maternal aunt; Dementia in her paternal grandmother; Diabetes in her mother; Heart disease in her paternal grandfather; Hypertension in her mother; Stroke in her maternal grandmother; Thyroid disease in her cousin and maternal aunt. ? ?SOCIAL HISTORY:  ?The patient  reports that she quit smoking about 2 years ago. Her smoking use included cigarettes. She smoked an average of .25 packs per day. She has never used smokeless tobacco. She reports current alcohol use. She reports that she does not currently use drugs after having used the following drugs: Marijuana. Frequency: 1.00 time per week. ? ?REVIEW OF SYSTEMS: ?Review of Systems  ?Constitutional: Negative for chills and fever.  ?HENT:  Negative for hoarse voice and nosebleeds.   ?Eyes:  Negative for discharge, double vision and pain.  ?Cardiovascular:  Negative for chest pain, claudication, dyspnea on exertion, leg swelling, near-syncope, orthopnea, palpitations, paroxysmal nocturnal dyspnea and syncope.  ?Respiratory:  Negative for hemoptysis and shortness of breath.   ?Musculoskeletal:  Negative for muscle cramps and myalgias.  ?Gastrointestinal:  Negative for abdominal pain, constipation,  diarrhea, hematemesis, hematochezia, melena, nausea and vomiting.  ?Neurological:  Negative for dizziness and light-headedness.  ? ?PHYSICAL EXAM: ? ?  02/04/2022  ? 10:22 AM 03/10/2021  ?  1:39 PM 02/05/2021  ?  1:52 PM  ?Vitals with BMI  ?Height 5' 2.5" 5' 5.25" 5\' 5"   ?Weight 248 lbs 240 lbs 243 lbs  ?BMI 44.61 39.65 40.44  ?Systolic 131 148 [id number]  ?Diastolic 83 86 73  ?Pulse 86 68 84  ? ? ?CONSTITUTIONAL: Well-developed and well-nourished. No acute  distress.  ?SKIN: Skin is warm and dry. No rash noted. No cyanosis. No pallor. No jaundice ?HEAD: Normocephalic and atraumatic.  ?EYES: No scleral icterus ?MOUTH/THROAT: Moist oral membranes.  ?NECK: No JVD present. No thyromegaly noted. No carotid bruits  ?LYMPHATIC: No visible cervical adenopathy.  ?CHEST Normal respiratory effort. No intercostal retractions  ?LUNGS: Clear to auscultation bilaterally. No stridor. No wheezes. No rales.  ?CARDIOVASCULAR: Regular rate and rhythm, positive S1-S2, no murmurs rubs or gallops appreciated. ?ABDOMINAL: Obese, soft, nontender, nondistended, positive bowel sounds all 4 quadrants. No apparent ascites.  ?EXTREMITIES: No peripheral edema  ?HEMATOLOGIC: No significant bruising ?NEUROLOGIC: Oriented to person, place, and time. Nonfocal. Normal muscle tone.  ?PSYCHIATRIC: Normal mood and affect. Normal behavior. Cooperative. ?No significant change in physical examination since last office visit ? ?CARDIAC DATABASE: ?EKG: ?02/04/2022: Normal sinus rhythm, 77 bpm, normal axis, without underlying ischemia injury pattern.   ? ?Echocardiogram: ?12/08/2020: ?Left ventricle cavity is normal in size and wall thickness. Normal global wall motion. Normal LV systolic function with visual EF 55-60%. Normal diastolic filling pattern.  ?No significant valvular regurgitation.  ?No evidence of pulmonary hypertension.. ?  ?Stress Testing: ?Treadmill Exercise Stress 12/08/2020: ?Stress EKG is normal. The patient exercised for 6 minutes and 0 seconds on Bruce protocol; achieved 7.05 METs at 87% of maximum predicted heart rate. Low exercise tolerance. Chest pain is not present.  ?The heart rate response was normal.  ?The blood pressure response was normal. ? ?Heart Catheterization: ?None ? ?LABORATORY DATA: ? ?  Latest Ref Rng & Units 10/29/2020  ?  3:38 PM 05/05/2009  ? 11:30 AM 08/16/2007  ?  8:27 AM  ?CBC  ?WBC 3.4 - 10.8 x10E3/uL 6.3   4.9   5.7    ?Hemoglobin 11.1 - 15.9 g/dL 09.613.4   04.513.7   40.913.5     ?Hematocrit 34.0 - 46.6 % 40.1   39.5   39.2    ?Platelets 150 - 450 x10E3/uL 278   263   265    ? ? ? ?  Latest Ref Rng & Units 10/29/2020  ?  3:38 PM 05/05/2009  ? 11:30 AM 08/13/2007  ?  8:55 AM  ?CMP  ?Glucose 65 - 99 mg/dL 89   87     ?BUN 6 - 24 mg/dL 8   8   10     ?Creatinine 0.57 - 1.00 mg/dL 8.110.80   9.140.63   7.820.70    ?Sodium 134 - 144 mmol/L 137   137     ?Potassium 3.5 - 5.2 mmol/L 4.1   4.2     ?Chloride 96 - 106 mmol/L 100   105     ?CO2 20 - 29 mmol/L 20   25     ?Calcium 8.7 - 10.2 mg/dL 8.8   8.7     ?Total Protein 6.0 - 8.5 g/dL 7.0   7.1     ?Total Bilirubin 0.0 - 1.2 mg/dL 0.2   0.7     ?  Alkaline Phos 44 - 121 IU/L 85   79     ?AST 0 - 40 IU/L 10   20   18     ?ALT 0 - 32 IU/L 8   19     ? ? ?Lipid Panel  ?   ?Component Value Date/Time  ? CHOL 206 (H) 02/03/2021 0957  ? TRIG 129 02/03/2021 0957  ? HDL 44 02/03/2021 0957  ? CHOLHDL 4.7 (H) 02/03/2021 0957  ? LDLCALC 139 (H) 02/03/2021 0957  ? LABVLDL 23 02/03/2021 0957  ? ? ?No components found for: NTPROBNP ?No results for input(s): PROBNP in the last 8760 hours. ?No results for input(s): TSH in the last 8760 hours. ? ? ?BMP ?No results for input(s): NA, K, CL, CO2, GLUCOSE, BUN, CREATININE, CALCIUM, GFRNONAA, GFRAA in the last 8760 hours. ? ? ?HEMOGLOBIN A1C ?No results found for: HGBA1C, MPG ? ?IMPRESSION: ? ?  ICD-10-CM   ?1. Precordial pain  R07.2 EKG 12-Lead  ?  ?2. Former smoker  Z87.891   ?  ?3. Class 3 severe obesity due to excess calories without serious comorbidity with body mass index (BMI) of 40.0 to 44.9 in adult [facility])  E66.01   ? Z68.41   ?  ?  ? ?RECOMMENDATIONS: ?Veronica Contreras is a 46 y.o. female whose past medical history and cardiac risk factors include: Intermittent cigarette smoking, obesity due to excess calories. ? ?Since last office visit patient has not had any precordial discomfort.  Her EKG today shows normal sinus rhythm without underlying ischemia or injury pattern.  Echocardiogram notes preserved LVEF and her recent GXT is  overall low risk study.  Her cholesterol from 2022 or not well controlled in the shared decision was to implement lifestyle changes.  During last office visit her estimated 10-year risk of ASCVD was less than

## 2022-02-05 ENCOUNTER — Ambulatory Visit: Payer: No Typology Code available for payment source | Admitting: Cardiology

## 2022-03-26 ENCOUNTER — Encounter: Payer: No Typology Code available for payment source | Admitting: Medical

## 2022-04-14 DIAGNOSIS — R300 Dysuria: Secondary | ICD-10-CM | POA: Diagnosis not present

## 2022-04-14 DIAGNOSIS — N3001 Acute cystitis with hematuria: Secondary | ICD-10-CM | POA: Diagnosis not present

## 2022-04-15 ENCOUNTER — Ambulatory Visit (INDEPENDENT_AMBULATORY_CARE_PROVIDER_SITE_OTHER): Payer: 59 | Admitting: Medical

## 2022-04-15 ENCOUNTER — Encounter: Payer: Self-pay | Admitting: Medical

## 2022-04-15 VITALS — BP 106/64 | HR 74 | Ht 65.25 in | Wt 246.4 lb

## 2022-04-15 DIAGNOSIS — E559 Vitamin D deficiency, unspecified: Secondary | ICD-10-CM | POA: Diagnosis not present

## 2022-04-15 DIAGNOSIS — M1990 Unspecified osteoarthritis, unspecified site: Secondary | ICD-10-CM | POA: Diagnosis not present

## 2022-04-15 DIAGNOSIS — L409 Psoriasis, unspecified: Secondary | ICD-10-CM

## 2022-04-15 DIAGNOSIS — K219 Gastro-esophageal reflux disease without esophagitis: Secondary | ICD-10-CM | POA: Diagnosis not present

## 2022-04-15 DIAGNOSIS — Z0000 Encounter for general adult medical examination without abnormal findings: Secondary | ICD-10-CM

## 2022-04-15 DIAGNOSIS — Z131 Encounter for screening for diabetes mellitus: Secondary | ICD-10-CM | POA: Diagnosis not present

## 2022-04-15 DIAGNOSIS — E538 Deficiency of other specified B group vitamins: Secondary | ICD-10-CM | POA: Diagnosis not present

## 2022-04-15 DIAGNOSIS — Z13220 Encounter for screening for lipoid disorders: Secondary | ICD-10-CM | POA: Diagnosis not present

## 2022-04-15 DIAGNOSIS — Z6841 Body Mass Index (BMI) 40.0 and over, adult: Secondary | ICD-10-CM

## 2022-04-15 DIAGNOSIS — L4050 Arthropathic psoriasis, unspecified: Secondary | ICD-10-CM

## 2022-04-15 DIAGNOSIS — Z1231 Encounter for screening mammogram for malignant neoplasm of breast: Secondary | ICD-10-CM | POA: Diagnosis not present

## 2022-04-15 DIAGNOSIS — E049 Nontoxic goiter, unspecified: Secondary | ICD-10-CM

## 2022-04-15 DIAGNOSIS — Z1211 Encounter for screening for malignant neoplasm of colon: Secondary | ICD-10-CM | POA: Diagnosis not present

## 2022-04-15 NOTE — Progress Notes (Signed)
Subjective:   HPI  Veronica Contreras is a 46 y.o. female who presents for Chief Complaint  Patient presents with   fasting cpe    Fasting cpe, no concerns. No obgyn- needs one. Due for mammo this year    Patient Care Team: Desyre Calma, Camelia Eng, PA-C as PCP - General (Family Medicine) Sees dentist Sees eye doctor Medical Team: Dentist  Eye doctor Dr. Rex Kras, cardiology Dr. Leafy Kindle, [facility] Rheumatology Dr. Jamse [place], [facility] Dermatology   Concerns: Martin Majestic to urgent care last night for UTI  Quit smoking a year ago  Hx/o psoriatic arthritis - seeing rheumatology  Hx/o right foot fx, 5th metatarsal, no fam hx/o osteo  Vit B12 deficiency diagnosed 10/2020, had 2 injections here, been using oral B12 since January  Vit D deficiency - currently on supplement   Gynecological history: 1 pregnancies, 0 live births, ectopic Engaged, no concern for STD or pregnancy.  No desire to be on contraception  Reviewed their medical, surgical, family, social, medication, and allergy history and updated chart as appropriate.  Past Medical History:  Diagnosis Date   Acid reflux    B12 deficiency 10/2020   Ectopic pregnancy    Foot fracture, right    prior   Goiter    Psoriasis    nails   Vitamin D deficiency     Family History  Problem Relation Age of Onset   Hypertension Mother    Diabetes Mother        complication of medication   Arthritis Brother    Breast cancer Maternal Aunt    Thyroid disease Maternal Aunt    Stroke Maternal Grandmother    Aneurysm Paternal Grandmother    Dementia Paternal Grandmother    Heart disease Paternal Grandfather        CABG   Thyroid disease Cousin      Current Outpatient Medications:    b complex vitamins capsule, Take 1 capsule by mouth daily., Disp: 90 capsule, Rfl: 0   chlorhexidine (PERIDEX) 0.12 % solution, PLEASE SEE ATTACHED FOR DETAILED DIRECTIONS, Disp: , Rfl:    COSENTYX SENSOREADY, 300 MG, 150 MG/ML SOAJ,  Inject into the skin., Disp: , Rfl:    sulfamethoxazole-trimethoprim (BACTRIM DS) 800-160 MG tablet, Take by mouth., Disp: , Rfl:    cholecalciferol (VITAMIN D3) 25 MCG (1000 UNIT) tablet, Take 1 tablet (1,000 Units total) by mouth daily., Disp: 90 tablet, Rfl: 3  Allergies  Allergen Reactions   Vicodin [Hydrocodone-Acetaminophen] Nausea And Vomiting      Review of Systems Constitutional: -fever, -chills, -sweats, -unexpected weight change, -decreased appetite, -fatigue Allergy: -sneezing, -itching, -congestion Dermatology: -changing moles, --rash, -lumps ENT: -runny nose, -ear pain, -sore throat, -hoarseness, -sinus pain, -teeth pain, - ringing in ears, -hearing loss, -nosebleeds Cardiology: -chest pain, -palpitations, -swelling, -difficulty breathing when lying flat, -waking up short of breath Respiratory: -cough, -shortness of breath, -difficulty breathing with exercise or exertion, -wheezing, -coughing up blood Gastroenterology: -abdominal pain, -nausea, -vomiting, -diarrhea, -constipation, -blood in stool, -changes in bowel movement, -difficulty swallowing or eating Hematology: -bleeding, -bruising  Musculoskeletal: -joint aches, -muscle aches, -joint swelling, -back pain, -neck pain, -cramping, -changes in gait Ophthalmology: denies vision changes, eye redness, itching, discharge Urology: +burning with urination, -difficulty urinating, -blood in urine, -urinary frequency, -urgency, -incontinence Neurology: -headache, -weakness, -tingling, -numbness, -memory loss, -falls, -dizziness Psychology: -depressed mood, -agitation, -sleep problems Breast/gyn: -breast tendnerss, -discharge, -lumps, -vaginal discharge,- irregular periods, -heavy periods     Objective:  BP 106/64   Pulse 74  Ht 5' 5.25" (1.657 m)   Wt 246 lb 6.4 oz (111.8 kg)   LMP 04/02/2022   BMI 40.69 kg/m   General appearance: alert, no distress, WD/WN, African American female Skin: tattoo right upper and lower  arms separately, few small 1-3mm flat whitish colored macules on legs scattered, upper and lower HEENT: normocephalic, conjunctiva/corneas normal, sclerae anicteric, PERRLA, EOMi Neck: supple, no lymphadenopathy, generalized thyromegaly goiter, no specific nodules, no masses, normal ROM, no bruits Chest: non tender, normal shape and expansion Heart: RRR, normal S1, S2, no murmurs Lungs: CTA bilaterally, no wheezes, rhonchi, or rales Abdomen: +bs, soft, non tender, non distended, no masses, no hepatomegaly, no splenomegaly, no bruits Back: non tender, normal ROM, no scoliosis Musculoskeletal: upper extremities non tender, no obvious deformity, normal ROM throughout, lower extremities non tender, no obvious deformity, normal ROM throughout Extremities: no edema, no cyanosis, no clubbing Pulses: 2+ symmetric, upper and lower extremities, normal cap refill Neurological: alert, oriented x 3, CN2-12 intact, strength normal upper extremities and lower extremities, sensation normal throughout, DTRs 2+ throughout, no cerebellar signs, gait normal Psychiatric: normal affect, behavior normal, pleasant  Breast/gyn/rectal - deferred     Assessment and Plan :   Encounter Diagnoses  Name Primary?   Encounter for health maintenance examination in adult Yes   Screen for colon cancer    Psoriasis    Psoriatic arthritis ([place])    Goiter    Gastroesophageal reflux disease, unspecified whether esophagitis present    B12 deficiency    BMI 40.0-44.9, adult ([place])    Arthritis    Encounter for screening mammogram for malignant neoplasm of breast    Vitamin D deficiency    Screening for lipid disorders    Screening for diabetes mellitus     Today you had a preventative care visit or wellness visit.    Topics today may have included healthy lifestyle, diet, exercise, preventative care, vaccinations, sick and well care, proper use of emergency dept and after hours care, as well as other concerns.      Recommendations: Continue to return yearly for your annual wellness and preventative care visits.  This gives [place] a chance to discuss healthy lifestyle, exercise, vaccinations, review your chart record, and perform screenings where appropriate.  I recommend you see your eye doctor yearly for routine vision care.  I recommend you see your dentist yearly for routine dental care including hygiene visits twice yearly.   Vaccination recommendations were reviewed Immunization History  Administered Date(s) Administered   Influenza Split 09/09/2019   Influenza,inj,Quad PF,6+ Mos 09/09/2019, 02/03/2021   PFIZER(Purple Top)SARS-COV-2 Vaccination 07/29/2020, 08/19/2020   PPD Test 09/09/2019, 09/09/2019   Td 12/13/2007, 02/03/2021    Screening for cancer: Breast cancer screening: You should perform a self breast exam monthly.   We reviewed recommendations for regular mammograms and breast cancer screening.  Colon cancer screening:  Referral placed  Cervical cancer screening: We reviewed recommendations for pap smear screening.  Pap up to date 2022.  Skin cancer screening: Check your skin regularly for new changes, growing lesions, or other lesions of concern Come in for evaluation if you have skin lesions of concern.  Lung cancer screening: If you have a greater than 30 pack year history of tobacco use, then you qualify for lung cancer screening with a chest CT scan  We currently don't have screenings for other cancers besides breast, cervical, colon, and lung cancers.  If you have a strong family history of cancer or have other cancer screening  concerns, please let me know.    Bone health: Get at least 150 minutes of aerobic exercise weekly Get weight bearing exercise at least once weekly You may need to have a bone density in your mid 50s due to history of fracture as and adult and vitamin D deficiency   Heart health: Get at least 150 minutes of aerobic exercise weekly Limit  alcohol It is important to maintain a healthy blood pressure and healthy cholesterol numbers    Separate significant issues discussed: B12 deficiency diagnosed December 2021-received 2 injections here and has been doing oral supplement.  Repeat labs today  Vitamin D deficiency-labs today, continue supplement  Psoriatic arthritis-managed by rheumatology  Psoriasis of nails-managed by dermatology  Goiter-plan for labs and updated ultrasound, No particular symptoms  BMI greater than 40-I recommend you work on losing weight through healthy eating habits, regular exercise.     [place] was seen today for fasting cpe.  Diagnoses and all orders for this visit:  Encounter for health maintenance examination in adult -     Ambulatory referral to Gastroenterology -     Comprehensive metabolic panel -     CBC -     Lipid panel -     TSH + free T4 -     Vitamin B12 -     VITAMIN D 25 Hydroxy (Vit-D Deficiency, Fractures) -     Hemoglobin A1c -     US THYROID; Future  Screen for colon cancer -     Ambulatory referral to Gastroenterology  Psoriasis  Psoriatic arthritis ([place])  Goiter -     US THYROID; Future  Gastroesophageal reflux disease, unspecified whether esophagitis present  B12 deficiency -     Vitamin B12  BMI 40.0-44.9, adult ([facility])  Arthritis  Encounter for screening mammogram for malignant neoplasm of breast  Vitamin D deficiency -     VITAMIN D 25 Hydroxy (Vit-D Deficiency, Fractures)  Screening for lipid disorders -     Lipid panel  Screening for diabetes mellitus -     Hemoglobin A1c     Follow-up pending labs

## 2022-04-16 LAB — LIPID PANEL
Chol/HDL Ratio: 4.4 ratio (ref 0.0–4.4)
Cholesterol, Total: 235 mg/dL — ABNORMAL HIGH (ref 100–199)
HDL: 54 mg/dL (ref >39)
LDL Chol Calc (NIH): 162 mg/dL — ABNORMAL HIGH (ref 0–99)
Triglycerides: 107 mg/dL (ref 0–149)
VLDL Cholesterol Cal: 19 mg/dL (ref 5–40)

## 2022-04-16 LAB — COMPREHENSIVE METABOLIC PANEL
ALT: 11 IU/L (ref 0–32)
AST: 16 IU/L (ref 0–40)
Albumin/Globulin Ratio: 1.3 (ref 1.2–2.2)
Albumin: 4 g/dL (ref 3.8–4.8)
Alkaline Phosphatase: 88 IU/L (ref 44–121)
BUN/Creatinine Ratio: 14 (ref 9–23)
BUN: 10 mg/dL (ref 6–24)
Bilirubin Total: 0.2 mg/dL (ref 0.0–1.2)
CO2: 21 mmol/L (ref 20–29)
Calcium: 8.9 mg/dL (ref 8.7–10.2)
Chloride: 101 mmol/L (ref 96–106)
Creatinine, Ser: 0.7 mg/dL (ref 0.57–1.00)
Globulin, Total: 3 g/dL (ref 1.5–4.5)
Glucose: 81 mg/dL (ref 70–99)
Potassium: 4.2 mmol/L (ref 3.5–5.2)
Sodium: 135 mmol/L (ref 134–144)
Total Protein: 7 g/dL (ref 6.0–8.5)
eGFR: 109 mL/min/{1.73_m2} (ref >59)

## 2022-04-16 LAB — TSH+FREE T4
Free T4: 1.03 ng/dL (ref 0.82–1.77)
TSH: 1.51 u[IU]/mL (ref 0.450–4.500)

## 2022-04-16 LAB — HEMOGLOBIN A1C
Est. average glucose Bld gHb Est-mCnc: 108 mg/dL
Hgb A1c MFr Bld: 5.4 % (ref 4.8–5.6)

## 2022-04-16 LAB — CBC
Hematocrit: 39.2 % (ref 34.0–46.6)
Hemoglobin: 13 g/dL (ref 11.1–15.9)
MCH: 31.3 pg (ref 26.6–33.0)
MCHC: 33.2 g/dL (ref 31.5–35.7)
MCV: 94 fL (ref 79–97)
Platelets: 405 10*3/uL (ref 150–450)
RBC: 4.16 x10E6/uL (ref 3.77–5.28)
RDW: 13 % (ref 11.7–15.4)
WBC: 5.3 10*3/uL (ref 3.4–10.8)

## 2022-04-16 LAB — VITAMIN B12: Vitamin B-12: 1114 pg/mL (ref 232–1245)

## 2022-04-16 LAB — VITAMIN D 25 HYDROXY (VIT D DEFICIENCY, FRACTURES): Vit D, 25-Hydroxy: 28.6 ng/mL — ABNORMAL LOW (ref 30.0–100.0)

## 2022-04-19 ENCOUNTER — Other Ambulatory Visit: Payer: Self-pay | Admitting: Medical

## 2022-04-19 MED ORDER — VITAMIN D 50 MCG (2000 UT) PO CAPS: 1.0000 | ORAL_CAPSULE | Freq: Every day | ORAL | 3 refills | Status: DC

## 2022-04-20 ENCOUNTER — Other Ambulatory Visit: Payer: Self-pay | Admitting: Medical

## 2022-04-20 MED ORDER — ROSUVASTATIN CALCIUM 10 MG PO TABS: 10.0000 mg | ORAL_TABLET | ORAL | 0 refills | Status: DC

## 2022-05-11 ENCOUNTER — Ambulatory Visit
Admission: RE | Admit: 2022-05-11 | Discharge: 2022-05-11 | Disposition: A | Discharge status: Home / Self Care | Payer: 59 | Source: Ambulatory Visit | Attending: Medical | Admitting: Medical

## 2022-05-11 DIAGNOSIS — E049 Nontoxic goiter, unspecified: Secondary | ICD-10-CM

## 2022-05-11 DIAGNOSIS — E042 Nontoxic multinodular goiter: Secondary | ICD-10-CM | POA: Diagnosis not present

## 2022-05-11 DIAGNOSIS — Z86010 Personal history of colonic polyps: Secondary | ICD-10-CM | POA: Diagnosis not present

## 2022-05-11 DIAGNOSIS — Z0000 Encounter for general adult medical examination without abnormal findings: Secondary | ICD-10-CM

## 2022-05-11 DIAGNOSIS — K5730 Diverticulosis of large intestine without perforation or abscess without bleeding: Secondary | ICD-10-CM | POA: Diagnosis not present

## 2022-05-11 DIAGNOSIS — R635 Abnormal weight gain: Secondary | ICD-10-CM | POA: Diagnosis not present

## 2022-05-11 DIAGNOSIS — Z1211 Encounter for screening for malignant neoplasm of colon: Secondary | ICD-10-CM | POA: Diagnosis not present

## 2022-05-12 ENCOUNTER — Other Ambulatory Visit: Payer: Self-pay | Admitting: Medical

## 2022-05-12 ENCOUNTER — Other Ambulatory Visit: Payer: Self-pay | Admitting: Internal Medicine

## 2022-05-12 DIAGNOSIS — E049 Nontoxic goiter, unspecified: Secondary | ICD-10-CM

## 2022-05-12 DIAGNOSIS — Z1231 Encounter for screening mammogram for malignant neoplasm of breast: Secondary | ICD-10-CM

## 2022-06-10 ENCOUNTER — Ambulatory Visit (INDEPENDENT_AMBULATORY_CARE_PROVIDER_SITE_OTHER): Payer: 59 | Admitting: Medical

## 2022-06-10 ENCOUNTER — Other Ambulatory Visit: Payer: Self-pay | Admitting: Medical

## 2022-06-10 ENCOUNTER — Encounter: Payer: Self-pay | Admitting: Medical

## 2022-06-10 VITALS — BP 100/70 | HR 68 | Wt 236.0 lb

## 2022-06-10 DIAGNOSIS — E049 Nontoxic goiter, unspecified: Secondary | ICD-10-CM

## 2022-06-10 DIAGNOSIS — E785 Hyperlipidemia, unspecified: Secondary | ICD-10-CM

## 2022-06-10 DIAGNOSIS — E538 Deficiency of other specified B group vitamins: Secondary | ICD-10-CM | POA: Diagnosis not present

## 2022-06-10 DIAGNOSIS — Z0000 Encounter for general adult medical examination without abnormal findings: Secondary | ICD-10-CM

## 2022-06-10 DIAGNOSIS — E559 Vitamin D deficiency, unspecified: Secondary | ICD-10-CM

## 2022-06-10 DIAGNOSIS — Z131 Encounter for screening for diabetes mellitus: Secondary | ICD-10-CM

## 2022-06-10 NOTE — Progress Notes (Signed)
Subjective:  Veronica Contreras is a 46 y.o. female who presents for Chief Complaint  Patient presents with   Follow-up    Follow up cholesterol meds.     Here for med check.  I saw her in early June for a physical.  At that time we had her start 2000 units of vitamin D daily and Crestor.  She is compliant with both medications without complaint.  She is working on [employer] since last visit.  Walking about 6 days per week, walking several miles daily.  Has lost some weight since last visit.     Last visit we referred for colonoscopy.  Appt pending 06/29/22 with Dr. Loreta Contreras for colonoscopy.   Sees ENT in September 2023.  Has mammogram and colonoscopy same day coming up.  Last visit we had her go for updated ultrasound of thyroid which was done on May 11, 2022.  B12 deficiency - taking gummy B12 daily.  Nonsmoker.  Quit smoking last year, but had smoked several years, but a few cigarettes daily, not heavy smoking.  No other aggravating or relieving factors.    No other c/o.  Past Medical History:  Diagnosis Date   Acid reflux    B12 deficiency 10/2020   Ectopic pregnancy    Foot fracture, right    prior   Goiter    Psoriasis    nails   Vitamin D deficiency    Current Outpatient Medications on File Prior to Visit  Medication Sig Dispense Refill   Cholecalciferol (VITAMIN D) 50 MCG (2000 UT) CAPS Take 1 capsule (2,000 Units total) by mouth daily. 90 capsule 3   COSENTYX SENSOREADY, 300 MG, 150 MG/ML SOAJ Inject into the skin.     rosuvastatin (CRESTOR) 10 MG tablet Take 1 tablet (10 mg total) by mouth every other day. 90 tablet 0   No current facility-administered medications on file prior to visit.   Family History  Problem Relation Age of Onset   Hypertension Mother    Diabetes Mother        complication of medication   Arthritis Brother    Breast cancer Maternal Aunt    Thyroid disease Maternal Aunt    Stroke Maternal Grandmother    Aneurysm Paternal Grandmother     Dementia Paternal Grandmother    Heart disease Paternal Grandfather        CABG   Thyroid disease Cousin      The following portions of the patient's history were reviewed and updated as appropriate: allergies, current medications, past family history, past medical history, past social history, past surgical history and problem list.  ROS Otherwise as in subjective above    Objective: BP 100/70   Pulse 68   Wt 236 lb (107 kg)   SpO2 100%   BMI 38.97 kg/m   Wt Readings from Last 3 Encounters:  06/10/22 236 lb (107 kg)  04/15/22 246 lb 6.4 oz (111.8 kg)  02/04/22 248 lb (112.5 kg)   BP Readings from Last 3 Encounters:  06/10/22 100/70  04/15/22 106/64  02/04/22 131/83     General appearance: alert, no distress, well developed, well nourished   Assessment: Encounter Diagnoses  Name Primary?   Hyperlipidemia, unspecified hyperlipidemia type Yes   B12 deficiency    Goiter    Vitamin D deficiency      Plan: I congratulated her on her diet changes exercise and weight loss since June.  Continue good efforts  Counseled on diet and exercise  Hyperlipidemia-compliant with Crestor.  Labs today for starting medicine in June.  She does not have any other risk factors other than high cholesterol so the idea would be if she can make continued healthy food choices and continue weight loss between now and next year we would hopefully be able to stop the medicine next year  Vitamin D deficiency-compliant with vitamin D.  This was started in June.  Counseled on diet and sun exposure  Goiter-I have referred her to ENT for second opinion on recommendations since there is only slight interval enlargement of the 1 nodule from prior 2019 findings and biopsy.  We discussed either repeating ultrasound in a year or going for biopsy now  Follow-up as planned for colonoscopy next month  The plan will be for her to continue statin and vitamin D for the next year but stopped about 6 weeks  prior to next year's physical in June and come in for fasting labs prior to the physical  Veronica Contreras was seen today for follow-up.  Diagnoses and all orders for this visit:  Hyperlipidemia, unspecified hyperlipidemia type -     Lipid panel -     Comprehensive metabolic panel  B12 deficiency  Goiter -     Comprehensive metabolic panel  Vitamin D deficiency -     Comprehensive metabolic panel    Follow up: pending labs

## 2022-06-11 LAB — COMPREHENSIVE METABOLIC PANEL
ALT: 10 IU/L (ref 0–32)
AST: 15 IU/L (ref 0–40)
Albumin/Globulin Ratio: 1.8 (ref 1.2–2.2)
Albumin: 4.4 g/dL (ref 3.9–4.9)
Alkaline Phosphatase: 90 IU/L (ref 44–121)
BUN/Creatinine Ratio: 14 (ref 9–23)
BUN: 10 mg/dL (ref 6–24)
Bilirubin Total: 0.4 mg/dL (ref 0.0–1.2)
CO2: 20 mmol/L (ref 20–29)
Calcium: 9 mg/dL (ref 8.7–10.2)
Chloride: 105 mmol/L (ref 96–106)
Creatinine, Ser: 0.74 mg/dL (ref 0.57–1.00)
Globulin, Total: 2.4 g/dL (ref 1.5–4.5)
Glucose: 86 mg/dL (ref 70–99)
Potassium: 4.6 mmol/L (ref 3.5–5.2)
Sodium: 139 mmol/L (ref 134–144)
Total Protein: 6.8 g/dL (ref 6.0–8.5)
eGFR: 102 mL/min/{1.73_m2} (ref >59)

## 2022-06-11 LAB — LIPID PANEL
Chol/HDL Ratio: 3 ratio (ref 0.0–4.4)
Cholesterol, Total: 131 mg/dL (ref 100–199)
HDL: 43 mg/dL (ref >39)
LDL Chol Calc (NIH): 70 mg/dL (ref 0–99)
Triglycerides: 93 mg/dL (ref 0–149)
VLDL Cholesterol Cal: 18 mg/dL (ref 5–40)

## 2022-06-15 HISTORY — PX: COLONOSCOPY: SHX174

## 2022-06-30 ENCOUNTER — Ambulatory Visit
Admission: RE | Admit: 2022-06-30 | Discharge: 2022-06-30 | Disposition: A | Discharge status: Home / Self Care | Payer: 59 | Source: Ambulatory Visit | Attending: Medical | Admitting: Medical

## 2022-06-30 DIAGNOSIS — Z1211 Encounter for screening for malignant neoplasm of colon: Secondary | ICD-10-CM | POA: Diagnosis not present

## 2022-06-30 DIAGNOSIS — K5730 Diverticulosis of large intestine without perforation or abscess without bleeding: Secondary | ICD-10-CM | POA: Diagnosis not present

## 2022-06-30 DIAGNOSIS — Z1231 Encounter for screening mammogram for malignant neoplasm of breast: Secondary | ICD-10-CM | POA: Diagnosis not present

## 2022-06-30 LAB — HM COLONOSCOPY

## 2022-07-02 ENCOUNTER — Encounter: Payer: Self-pay | Admitting: Internal Medicine

## 2022-07-06 ENCOUNTER — Encounter: Payer: Self-pay | Admitting: Medical

## 2022-07-26 DIAGNOSIS — E042 Nontoxic multinodular goiter: Secondary | ICD-10-CM | POA: Diagnosis not present

## 2022-07-26 DIAGNOSIS — E041 Nontoxic single thyroid nodule: Secondary | ICD-10-CM | POA: Diagnosis not present

## 2022-07-27 ENCOUNTER — Other Ambulatory Visit: Payer: Self-pay | Admitting: Otolaryngology

## 2022-07-27 DIAGNOSIS — E041 Nontoxic single thyroid nodule: Secondary | ICD-10-CM

## 2022-07-29 DIAGNOSIS — R059 Cough, unspecified: Secondary | ICD-10-CM | POA: Diagnosis not present

## 2022-07-29 DIAGNOSIS — Z20822 Contact with and (suspected) exposure to covid-19: Secondary | ICD-10-CM | POA: Diagnosis not present

## 2022-07-29 DIAGNOSIS — R509 Fever, unspecified: Secondary | ICD-10-CM | POA: Diagnosis not present

## 2022-07-29 DIAGNOSIS — R112 Nausea with vomiting, unspecified: Secondary | ICD-10-CM | POA: Diagnosis not present

## 2022-08-18 ENCOUNTER — Ambulatory Visit (INDEPENDENT_AMBULATORY_CARE_PROVIDER_SITE_OTHER): Payer: 59 | Admitting: Medical

## 2022-08-18 VITALS — BP 120/70 | HR 64 | Wt 236.6 lb

## 2022-08-18 DIAGNOSIS — R200 Anesthesia of skin: Secondary | ICD-10-CM | POA: Diagnosis not present

## 2022-08-18 DIAGNOSIS — E785 Hyperlipidemia, unspecified: Secondary | ICD-10-CM

## 2022-08-18 DIAGNOSIS — Z23 Encounter for immunization: Secondary | ICD-10-CM | POA: Diagnosis not present

## 2022-08-18 MED ORDER — ROSUVASTATIN CALCIUM 10 MG PO TABS: 10.0000 mg | ORAL_TABLET | ORAL | 3 refills | Status: DC

## 2022-08-18 NOTE — Patient Instructions (Signed)
Symptoms suggests possible carpal tunnel syndrome, more likely than other possible causes of finger or arm numbness.  Recommendations: Begin Aleve over the counter in the morning for 1-2 weeks Begin carpal tunnel splint at night time for 2-3 weeks Stretch regularly If not improved in 2-3 weeks, I would refer to orthopedics If new symptoms different than today in the meantime, call or recheck Consider follow up in 3-4 weeks   Carpal Tunnel Syndrome  Carpal tunnel syndrome is a condition that causes pain, weakness, and numbness in your hand and arm. Numbness is when you cannot feel an area in your body. The carpal tunnel is a narrow area that is on the palm side of your wrist. Repeated wrist motion or certain diseases may cause swelling in the tunnel. This swelling can pinch the main nerve in the wrist. This nerve is called the median nerve. What are the causes? This condition may be caused by: Moving your hand and wrist over and over again while doing a task. Injury to the wrist. Arthritis. A sac of fluid (cyst) or abnormal growth (tumor) in the carpal tunnel. Fluid buildup during pregnancy. Use of tools that vibrate. Sometimes the cause is not known. What increases the risk? The following factors may make you more likely to have this condition: Having a job that makes you do these things: Move your hand over and over again. Work with tools that vibrate, such as drills or sanders. Being a woman. Having diabetes, obesity, thyroid problems, or kidney failure. What are the signs or symptoms? Symptoms of this condition include: A tingling feeling in your fingers. Tingling or loss of feeling in your hand. Pain in your entire arm. This pain may get worse when you bend your wrist and elbow for a long time. Pain in your wrist that goes up your arm to your shoulder. Pain that goes down into your palm or fingers. Weakness in your hands. You may find it hard to grab and hold items. You may  feel worse at night. How is this treated? This condition may be treated with: Lifestyle changes. You will be asked to stop or change the activity that caused your problem. Doing exercises and activities that make bones, muscles, and tendons stronger (physical therapy). Learning how to use your hand again (occupational therapy). Medicines for pain and swelling. You may have injections in your wrist. A wrist splint or brace. Surgery. Follow these instructions at home: If you have a splint or brace: Wear the splint or brace as told by your doctor. Take it off only as told by your doctor. Loosen the splint if your fingers: Tingle. Become numb. Turn cold and blue. Keep the splint or brace clean. If the splint or brace is not waterproof: Do not let it get wet. Cover it with a watertight covering when you take a bath or a shower. Managing pain, stiffness, and swelling If told, put ice on the painful area: If you have a removable splint or brace, remove it as told by your doctor. Put ice in a plastic bag. Place a towel between your skin and the bag. Leave the ice on for 20 minutes, 2-3 times per day. Do not fall asleep with the cold pack on your skin. Take off the ice if your skin turns bright red. This is very important. If you cannot feel pain, heat, or cold, you have a greater risk of damage to the area. Move your fingers often to reduce stiffness and swelling. General instructions Take  over-the-counter and prescription medicines only as told by your doctor. Rest your wrist from any activity that may cause pain. If needed, talk with your boss at work about changes that can help your wrist heal. Do exercises as told by your doctor, physical therapist, or occupational therapist. Keep all follow-up visits. Contact a doctor if: You have new symptoms. Medicine does not help your pain. Your symptoms get worse. Get help right away if: You have very bad numbness or tingling in your wrist or  hand. Summary Carpal tunnel syndrome is a condition that causes pain in your hand and arm. It is often caused by repeated wrist motions. Lifestyle changes and medicines are used to treat this problem. Surgery may help in very bad cases. Follow your doctor's instructions about wearing a splint, resting your wrist, keeping follow-up visits, and calling for help. This information is not intended to replace advice given to you by your health care provider. Make sure you discuss any questions you have with your health care provider. Document Revised: 03/13/2020 Document Reviewed: 03/13/2020 Elsevier Patient Education  2023 [employer].

## 2022-08-18 NOTE — Progress Notes (Signed)
Subjective:  Veronica Contreras is a 46 y.o. female who presents for Chief Complaint  Patient presents with   issues with new medication    Numbness in thumb and arm and wants to make sure this is not something with the medication     Here for flu shot  Here for concerns about numbness.  Left thumb feels numb and left arm feels heavy and feels numb.   This has been going on a few weeks right before getting covid.  Numbness is mainly elbow to hand.   Rest of fingers are not numb, just left thumb.     No neck pain.  No upper arm issues on left or right.   No recent injury or trauma.  In the past does rest arm on desk typing.  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    Been trying to recovered and get strength back from covid.   Got sick 07/29/22 with covid.    No other aggravating or relieving factors.    No other c/o.  The following portions of the patient's history were reviewed and updated as appropriate: allergies, current medications, past family history, past medical history, past social history, past surgical history and problem list.  ROS Otherwise as in subjective above   Objective: BP 120/70   Pulse 64   Wt 236 lb 9.6 oz (107.3 kg)   BMI 39.07 kg/m   Wt Readings from Last 3 Encounters:  08/18/22 236 lb 9.6 oz (107.3 kg)  06/10/22 236 lb (107 kg)  04/15/22 246 lb 6.4 oz (111.8 kg)   General appearance: alert, no distress, well developed, well nourished Neck: supple, no lymphadenopathy, no thyromegaly, no masses, normal ROM Arms nontender, normal ROM, no swelling or deformity Pulses: 2+ radial pulses, 2+ pedal pulses, normal cap refill Ext: no edema Neuro: normal strength, sensation, and negative tinel's and phalens, normal DTRs of arms   Assessment: Encounter Diagnoses  Name Primary?   Numbness of finger Yes   Needs flu shot    Arm numbness    Hyperlipidemia, unspecified hyperlipidemia type      Plan: Discussed possible differential, CTS, radicular issue from neck,  vitamin deficiency, tumor, or other.  Symptoms suggests possible carpal tunnel syndrome, more likely than other possible causes of finger or arm numbness.  Recommendations: Begin Aleve over the counter in the morning for 1-2 weeks Begin carpal tunnel splint at night time for 2-3 weeks Stretch regularly If not improved in 2-3 weeks, I would refer to orthopedics If new symptoms different than today in the meantime, call or recheck Consider follow up in 3-4 weeks  Counseled on the influenza virus vaccine.  Vaccine information sheet given.  Influenza vaccine given after consent obtained.   [place] was seen today for issues with new medication.  Diagnoses and all orders for this visit:  Numbness of finger  Needs flu shot -     Flu Vaccine QUAD 68mo+IM (Fluarix, Fluzone & Alfiuria Quad PF)  Arm numbness  Hyperlipidemia, unspecified hyperlipidemia type  Other orders -     rosuvastatin (CRESTOR) 10 MG tablet; Take 1 tablet (10 mg total) by mouth every other day.    Follow up: 3-4 weeks

## 2022-08-24 ENCOUNTER — Encounter: Payer: Self-pay | Admitting: Internal Medicine

## 2022-08-31 DIAGNOSIS — L700 Acne vulgaris: Secondary | ICD-10-CM | POA: Diagnosis not present

## 2022-08-31 DIAGNOSIS — L239 Allergic contact dermatitis, unspecified cause: Secondary | ICD-10-CM | POA: Diagnosis not present

## 2022-08-31 DIAGNOSIS — L400 Psoriasis vulgaris: Secondary | ICD-10-CM | POA: Diagnosis not present

## 2022-09-01 ENCOUNTER — Ambulatory Visit
Admission: RE | Admit: 2022-09-01 | Discharge: 2022-09-01 | Disposition: A | Discharge status: Home / Self Care | Payer: 59 | Source: Ambulatory Visit | Attending: Otolaryngology | Admitting: Otolaryngology

## 2022-09-01 ENCOUNTER — Other Ambulatory Visit (HOSPITAL_COMMUNITY)
Admission: RE | Admit: 2022-09-01 | Discharge: 2022-09-01 | Disposition: A | Discharge status: Home / Self Care | Payer: 59 | Source: Ambulatory Visit | Attending: Otolaryngology | Admitting: Otolaryngology

## 2022-09-01 DIAGNOSIS — E041 Nontoxic single thyroid nodule: Secondary | ICD-10-CM

## 2022-09-01 DIAGNOSIS — E042 Nontoxic multinodular goiter: Secondary | ICD-10-CM | POA: Diagnosis not present

## 2022-09-03 LAB — CYTOLOGY - NON PAP

## 2022-10-10 ENCOUNTER — Other Ambulatory Visit: Payer: Self-pay | Admitting: Medical

## 2023-04-06 DIAGNOSIS — N898 Other specified noninflammatory disorders of vagina: Secondary | ICD-10-CM | POA: Diagnosis not present

## 2023-04-06 DIAGNOSIS — Z124 Encounter for screening for malignant neoplasm of cervix: Secondary | ICD-10-CM | POA: Diagnosis not present

## 2023-04-06 DIAGNOSIS — Z1151 Encounter for screening for human papillomavirus (HPV): Secondary | ICD-10-CM | POA: Diagnosis not present

## 2023-04-06 DIAGNOSIS — Z113 Encounter for screening for infections with a predominantly sexual mode of transmission: Secondary | ICD-10-CM | POA: Diagnosis not present

## 2023-04-06 DIAGNOSIS — Z01419 Encounter for gynecological examination (general) (routine) without abnormal findings: Secondary | ICD-10-CM | POA: Diagnosis not present

## 2023-04-20 ENCOUNTER — Ambulatory Visit (INDEPENDENT_AMBULATORY_CARE_PROVIDER_SITE_OTHER): Payer: 59 | Admitting: Medical

## 2023-04-20 ENCOUNTER — Encounter: Payer: Self-pay | Admitting: Medical

## 2023-04-20 VITALS — BP 108/62 | HR 73 | Ht 67.0 in | Wt 259.0 lb

## 2023-04-20 DIAGNOSIS — E785 Hyperlipidemia, unspecified: Secondary | ICD-10-CM

## 2023-04-20 DIAGNOSIS — Z0000 Encounter for general adult medical examination without abnormal findings: Secondary | ICD-10-CM

## 2023-04-20 DIAGNOSIS — Z6841 Body Mass Index (BMI) 40.0 and over, adult: Secondary | ICD-10-CM | POA: Diagnosis not present

## 2023-04-20 DIAGNOSIS — E049 Nontoxic goiter, unspecified: Secondary | ICD-10-CM

## 2023-04-20 DIAGNOSIS — E559 Vitamin D deficiency, unspecified: Secondary | ICD-10-CM

## 2023-04-20 MED ORDER — WEGOVY 0.25 MG/0.5ML ~~LOC~~ SOAJ: 0.2500 mg | SUBCUTANEOUS | 0 refills | Status: DC

## 2023-04-20 NOTE — Progress Notes (Signed)
Subjective:   HPI  Veronica Contreras is a 47 y.o. female who presents for Chief Complaint  Patient presents with   Annual Exam    Fasting cpe, weight gain- would like something to help to lose    Patient Care Team: Idonia Zollinger, Kermit Balo, PA-C as PCP - General (Family Medicine) Veverly Fells, NP as Nurse Practitioner (Nurse Practitioner) Tessa Lerner, DO as Consulting Physician (Cardiology) Azucena Fallen, PA-C as Consulting Physician (Rheumatology) Bettey Costa, MD as Consulting Physician (Dermatology) Christia Reading, MD as Consulting Physician (Otolaryngology) Dentist  Eye doctor Dr. Randolm Idol, [facility]   Concerns: Had recent gyn visit last month  Hyperlipidemia - compliant with statin  Vit D deficiency - currently on supplement   Gynecological history: 1 pregnancies, 0 live births, ectopic Engaged, no concern for STD or pregnancy.  No desire to be on contraception  Reviewed their medical, surgical, family, social, medication, and allergy history and updated chart as appropriate.  Past Medical History:  Diagnosis Date   Acid reflux    B12 deficiency 10/2020   Ectopic pregnancy    Foot fracture, right    prior   Goiter    Psoriasis    nails   Vitamin D deficiency     Family History  Problem Relation Age of Onset   Hypertension Mother    Diabetes Mother        complication of medication   Arthritis Brother    Breast cancer Maternal Aunt    Thyroid disease Maternal Aunt    Stroke Maternal Grandmother    Aneurysm Paternal Grandmother    Dementia Paternal Grandmother    Heart disease Paternal Grandfather        CABG   Thyroid disease Cousin      Current Outpatient Medications:    Cholecalciferol (VITAMIN D) 50 MCG (2000 UT) CAPS, Take 1 capsule (2,000 Units total) by mouth daily., Disp: 90 capsule, Rfl: 3   rosuvastatin (CRESTOR) 10 MG tablet, TAKE 1 TABLET BY MOUTH EVERY OTHER DAY, Disp: 45 tablet, Rfl: 1   Semaglutide-Weight Management  (WEGOVY) 0.25 MG/0.5ML SOAJ, Inject 0.25 mg into the skin once a week., Disp: 2 mL, Rfl: 0  Allergies  Allergen Reactions   Vicodin [Hydrocodone-Acetaminophen] Nausea And Vomiting    Review of Systems  Constitutional:  Negative for chills, fever, malaise/fatigue and weight loss.  HENT:  Negative for congestion, ear pain, hearing loss, sore throat and tinnitus.   Eyes:  Negative for blurred vision, pain and redness.  Respiratory:  Negative for cough, hemoptysis and shortness of breath.   Cardiovascular:  Negative for chest pain, palpitations, orthopnea, claudication and leg swelling.  Gastrointestinal:  Negative for abdominal pain, blood in stool, constipation, diarrhea, nausea and vomiting.  Genitourinary:  Negative for dysuria, flank pain, frequency, hematuria and urgency.  Musculoskeletal:  Negative for falls, joint pain and myalgias.  Skin:  Negative for itching and rash.  Neurological:  Negative for dizziness, tingling, speech change, weakness and headaches.  Endo/Heme/Allergies:  Negative for polydipsia. Does not bruise/bleed easily.  Psychiatric/Behavioral:  Negative for depression and memory loss. The patient is not nervous/anxious and does not have insomnia.       Objective:  BP 108/62   Pulse 73   Ht 5\' 7"  (1.702 m)   Wt 259 lb (117.5 kg)   BMI 40.57 kg/m   Wt Readings from Last 3 Encounters:  04/20/23 259 lb (117.5 kg)  08/18/22 236 lb 9.6 oz (107.3 kg)  06/10/22 236 lb (107  kg)   BP Readings from Last 3 Encounters:  04/20/23 108/62  08/18/22 120/70  06/10/22 100/70    General appearance: alert, no distress, WD/WN, African American female Skin: tattoo right upper and lower arms separately, few small 1-24mm flat whitish colored macules on legs scattered, upper and lower HEENT: normocephalic, conjunctiva/corneas normal, sclerae anicteric, PERRLA, EOMi Neck: supple, no lymphadenopathy, generalized thyromegaly goiter, no specific nodules, no masses, normal ROM, no  bruits Chest: non tender, normal shape and expansion Heart: RRR, normal S1, S2, no murmurs Lungs: CTA bilaterally, no wheezes, rhonchi, or rales Abdomen: +bs, soft, non tender, non distended, no masses, no hepatomegaly, no splenomegaly, no bruits Back: non tender, normal ROM, no scoliosis Musculoskeletal: upper extremities non tender, no obvious deformity, normal ROM throughout, lower extremities non tender, no obvious deformity, normal ROM throughout Extremities: no edema, no cyanosis, no clubbing Pulses: 2+ symmetric, upper and lower extremities, normal cap refill Neurological: alert, oriented x 3, CN2-12 intact, strength normal upper extremities and lower extremities, sensation normal throughout, DTRs 2+ throughout, no cerebellar signs, gait normal Psychiatric: normal affect, behavior normal, pleasant  Breast/gyn/rectal - deferred     Assessment and Plan :   Encounter Diagnoses  Name Primary?   Routine general medical examination at a health care facility Yes   Hyperlipidemia, unspecified hyperlipidemia type    Encounter for health maintenance examination in adult    Vitamin D deficiency    Goiter    BMI 40.0-44.9, adult [facility])     Today you had a preventative care visit or wellness visit.    Topics today may have included healthy lifestyle, diet, exercise, preventative care, vaccinations, sick and well care, proper use of emergency dept and after hours care, as well as other concerns.     Recommendations: Continue to return yearly for your annual wellness and preventative care visits.  This gives [place] a chance to discuss healthy lifestyle, exercise, vaccinations, review your chart record, and perform screenings where appropriate.  I recommend you see your eye doctor yearly for routine vision care.  I recommend you see your dentist yearly for routine dental care including hygiene visits twice yearly.   Vaccination recommendations were reviewed Immunization History   Administered Date(s) Administered   COVID-19, mRNA, vaccine(Comirnaty)12 years and older 12/11/2022   Influenza Inj Mdck Quad Pf 12/11/2022   Influenza Split 09/09/2019   Influenza,inj,Quad PF,6+ Mos 09/09/2019, 02/03/2021, 08/18/2022   Influenza-Unspecified 09/09/2019   PFIZER(Purple Top)SARS-COV-2 Vaccination 07/29/2020, 08/19/2020   PPD Test 09/09/2019, 09/09/2019   Td 12/13/2007, 02/03/2021    Screening for cancer: Breast cancer screening: You should perform a self breast exam monthly.   We reviewed recommendations for regular mammograms and breast cancer screening.  Colon cancer screening:  Reviewed 06/2022 colonoscopy, repeat 10 years  Cervical cancer screening: We reviewed recommendations for pap smear screening.  Pap up to date 2022.  Skin cancer screening: Check your skin regularly for new changes, growing lesions, or other lesions of concern Come in for evaluation if you have skin lesions of concern.  Lung cancer screening: If you have a greater than 30 pack year history of tobacco use, then you qualify for lung cancer screening with a chest CT scan  We currently don't have screenings for other cancers besides breast, cervical, colon, and lung cancers.  If you have a strong family history of cancer or have other cancer screening concerns, please let me know.    Bone health: Get at least 150 minutes of aerobic exercise weekly Get weight  bearing exercise at least once weekly You may need to have a bone density in your mid 50s due to history of fracture as and adult and vitamin D deficiency   Heart health: Get at least 150 minutes of aerobic exercise weekly Limit alcohol It is important to maintain a healthy blood pressure and healthy cholesterol numbers    Separate significant issues discussed: Obesity  Against surgery.  2 friends died shortly after weight loss surgery Counseled on diet, more fruits and vegetables Cut down or cut out carbs for now Increase  exercise such as more mileage at [employer] [place] Consider working with trainer Consider medication support.  She wants to try medication to help.  B12 deficiency diagnosed December 2021-uses supplement regularly  Vitamin D deficiency-labs today, continue supplement  Goiter-reivewed benign biopsies from last year 2023.   Jarrett was seen today for annual exam.  Diagnoses and all orders for this visit:  Routine general medical examination at a health care facility -     CMP14+EGFR -     CBC with Differential/Platelet -     Lipid panel -     TSH + free T4 -     VITAMIN D 25 Hydroxy (Vit-D Deficiency, Fractures)  Hyperlipidemia, unspecified hyperlipidemia type -     Lipid panel  Encounter for health maintenance examination in adult  Vitamin D deficiency -     VITAMIN D 25 Hydroxy (Vit-D Deficiency, Fractures)  Goiter -     TSH + free T4  BMI 40.0-44.9, adult (HCC)  Other orders -     Semaglutide-Weight Management (WEGOVY) 0.25 MG/0.5ML SOAJ; Inject 0.25 mg into the skin once a week.    Follow-up pending labs

## 2023-04-21 ENCOUNTER — Other Ambulatory Visit: Payer: Self-pay | Admitting: Medical

## 2023-04-21 ENCOUNTER — Telehealth: Payer: Self-pay | Admitting: Medical

## 2023-04-21 LAB — CMP14+EGFR
ALT: 9 IU/L (ref 0–32)
AST: 14 IU/L (ref 0–40)
Albumin/Globulin Ratio: 1.5 (ref 1.2–2.2)
Albumin: 3.8 g/dL — ABNORMAL LOW (ref 3.9–4.9)
Alkaline Phosphatase: 88 IU/L (ref 44–121)
BUN/Creatinine Ratio: 13 (ref 9–23)
BUN: 10 mg/dL (ref 6–24)
Bilirubin Total: <0.2 mg/dL (ref 0.0–1.2)
CO2: 21 mmol/L (ref 20–29)
Calcium: 8.5 mg/dL — ABNORMAL LOW (ref 8.7–10.2)
Chloride: 106 mmol/L (ref 96–106)
Creatinine, Ser: 0.78 mg/dL (ref 0.57–1.00)
Globulin, Total: 2.6 g/dL (ref 1.5–4.5)
Glucose: 96 mg/dL (ref 70–99)
Potassium: 4.6 mmol/L (ref 3.5–5.2)
Sodium: 137 mmol/L (ref 134–144)
Total Protein: 6.4 g/dL (ref 6.0–8.5)
eGFR: 95 mL/min/{1.73_m2} (ref >59)

## 2023-04-21 LAB — VITAMIN D 25 HYDROXY (VIT D DEFICIENCY, FRACTURES): Vit D, 25-Hydroxy: 30.4 ng/mL (ref 30.0–100.0)

## 2023-04-21 LAB — LIPID PANEL
Chol/HDL Ratio: 2.8 ratio (ref 0.0–4.4)
Cholesterol, Total: 130 mg/dL (ref 100–199)
HDL: 46 mg/dL (ref >39)
LDL Chol Calc (NIH): 64 mg/dL (ref 0–99)
Triglycerides: 107 mg/dL (ref 0–149)
VLDL Cholesterol Cal: 20 mg/dL (ref 5–40)

## 2023-04-21 LAB — CBC WITH DIFFERENTIAL/PLATELET
Basophils Absolute: 0 10*3/uL (ref 0.0–0.2)
Basos: 1 %
EOS (ABSOLUTE): 0.2 10*3/uL (ref 0.0–0.4)
Eos: 5 %
Hematocrit: 38.9 % (ref 34.0–46.6)
Hemoglobin: 12.3 g/dL (ref 11.1–15.9)
Immature Grans (Abs): 0 10*3/uL (ref 0.0–0.1)
Immature Granulocytes: 0 %
Lymphocytes Absolute: 1.9 10*3/uL (ref 0.7–3.1)
Lymphs: 44 %
MCH: 31.4 pg (ref 26.6–33.0)
MCHC: 31.6 g/dL (ref 31.5–35.7)
MCV: 99 fL — ABNORMAL HIGH (ref 79–97)
Monocytes Absolute: 0.4 10*3/uL (ref 0.1–0.9)
Monocytes: 9 %
Neutrophils Absolute: 1.8 10*3/uL (ref 1.4–7.0)
Neutrophils: 41 %
Platelets: 309 10*3/uL (ref 150–450)
RBC: 3.92 x10E6/uL (ref 3.77–5.28)
RDW: 13.2 % (ref 11.7–15.4)
WBC: 4.3 10*3/uL (ref 3.4–10.8)

## 2023-04-21 LAB — TSH+FREE T4
Free T4: 1.08 ng/dL (ref 0.82–1.77)
TSH: 1.39 u[IU]/mL (ref 0.450–4.500)

## 2023-04-21 MED ORDER — VITAMIN D (ERGOCALCIFEROL) 1.25 MG (50000 UNIT) PO CAPS
50000.0000 [IU] | ORAL_CAPSULE | ORAL | 3 refills | Status: DC
Start: 2023-04-21 — End: 2024-04-22

## 2023-04-21 MED ORDER — ROSUVASTATIN CALCIUM 10 MG PO TABS
10.0000 mg | ORAL_TABLET | ORAL | 3 refills | Status: DC
Start: 2023-04-21 — End: 2023-06-27

## 2023-04-21 NOTE — Telephone Encounter (Signed)
P.A. WEGOVY 

## 2023-04-21 NOTE — Progress Notes (Signed)
Results sent through MyChart

## 2023-04-26 ENCOUNTER — Ambulatory Visit: Payer: No Typology Code available for payment source | Admitting: Cardiology

## 2023-05-03 DIAGNOSIS — H524 Presbyopia: Secondary | ICD-10-CM | POA: Diagnosis not present

## 2023-05-03 DIAGNOSIS — Z0100 Encounter for examination of eyes and vision without abnormal findings: Secondary | ICD-10-CM | POA: Diagnosis not present

## 2023-05-07 NOTE — Telephone Encounter (Signed)
P.A. approved til 11/21/23, sent mychart message

## 2023-05-27 ENCOUNTER — Other Ambulatory Visit: Payer: Self-pay | Admitting: Medical

## 2023-05-27 DIAGNOSIS — Z1231 Encounter for screening mammogram for malignant neoplasm of breast: Secondary | ICD-10-CM

## 2023-06-27 ENCOUNTER — Other Ambulatory Visit: Payer: Self-pay | Admitting: Medical

## 2023-07-04 ENCOUNTER — Ambulatory Visit
Admission: RE | Admit: 2023-07-04 | Discharge: 2023-07-04 | Disposition: A | Discharge status: Home / Self Care | Payer: No Typology Code available for payment source | Source: Ambulatory Visit | Attending: Medical | Admitting: Medical

## 2023-07-04 DIAGNOSIS — Z1231 Encounter for screening mammogram for malignant neoplasm of breast: Secondary | ICD-10-CM

## 2023-07-05 NOTE — Progress Notes (Signed)
Results sent through MyChart

## 2023-07-14 ENCOUNTER — Ambulatory Visit: Payer: 59

## 2023-11-22 ENCOUNTER — Ambulatory Visit: Payer: No Typology Code available for payment source | Admitting: Medical

## 2023-11-22 ENCOUNTER — Encounter: Payer: Self-pay | Admitting: Medical

## 2023-11-22 VITALS — BP 128/84 | HR 76 | Ht 65.0 in | Wt 263.4 lb

## 2023-11-22 DIAGNOSIS — Z6841 Body Mass Index (BMI) 40.0 and over, adult: Secondary | ICD-10-CM

## 2023-11-22 DIAGNOSIS — E559 Vitamin D deficiency, unspecified: Secondary | ICD-10-CM | POA: Diagnosis not present

## 2023-11-22 DIAGNOSIS — Z131 Encounter for screening for diabetes mellitus: Secondary | ICD-10-CM

## 2023-11-22 DIAGNOSIS — E049 Nontoxic goiter, unspecified: Secondary | ICD-10-CM

## 2023-11-22 DIAGNOSIS — E538 Deficiency of other specified B group vitamins: Secondary | ICD-10-CM | POA: Diagnosis not present

## 2023-11-22 DIAGNOSIS — Z23 Encounter for immunization: Secondary | ICD-10-CM

## 2023-11-22 MED ORDER — WEGOVY 0.5 MG/0.5ML ~~LOC~~ SOAJ: 0.5000 mg | SUBCUTANEOUS | 0 refills | Status: DC

## 2023-11-22 MED ORDER — VITAMIN B-12 1000 MCG PO TABS: 1000.0000 ug | ORAL_TABLET | Freq: Every day | ORAL | 1 refills | Status: DC

## 2023-11-22 MED ORDER — WEGOVY 0.25 MG/0.5ML ~~LOC~~ SOAJ: 0.2500 mg | SUBCUTANEOUS | 0 refills | Status: DC

## 2023-11-22 NOTE — Progress Notes (Signed)
 Subjective:  Veronica Contreras is a 48 y.o. female who presents for Chief Complaint  Patient presents with   Consult    Would like to get back on Wegovy , did one box back in June 2024.      Here for follow up on weight loss efforts  She initially tried wegovy  started dose last year but insurance wouldn't cover it.  Now insurance will cover  She would like to go back on Wegovy  to help with weight loss.  Her insurance requires her to enroll in weight management program.   This program she has through insurance includes weekly coach, digital scale at home for monitoring weight, and diet coaching.     Current exercise: Starting now, hasn't been as active of late.  Dietary efforts: Working on healthy eating habits.  Medication: Tried Wegovy  last year once  Any side effects of medication: nausea  Compliant with vit D supplement   No other aggravating or relieving factors.    No other c/o.  Past Medical History:  Diagnosis Date   Acid reflux    B12 deficiency 10/2020   Ectopic pregnancy    Foot fracture, right    prior   Goiter    Psoriasis    nails   Vitamin D  deficiency     Current Outpatient Medications on File Prior to Visit  Medication Sig Dispense Refill   Risankizumab-rzaa (SKYRIZI, 150 MG DOSE, [place]) Inject 150 mg into the skin every 3 (three) months.     rosuvastatin  (CRESTOR ) 10 MG tablet TAKE 1 TABLET BY MOUTH EVERY OTHER DAY 45 tablet 1   Vitamin D , Ergocalciferol , (DRISDOL ) 1.25 MG (50000 UNIT) CAPS capsule Take 1 capsule (50,000 Units total) by mouth every 7 (seven) days. 12 capsule 3   Semaglutide -Weight Management (WEGOVY ) 0.25 MG/0.5ML SOAJ Inject 0.25 mg into the skin once a week. (Patient not taking: Reported on 11/22/2023) 2 mL 0   No current facility-administered medications on file prior to visit.    The following portions of the patient's history were reviewed and updated as appropriate: allergies, current medications, past family history, past medical  history, past social history, past surgical history and problem list.  ROS Otherwise as in subjective above   Objective: BP 128/84   Pulse 76   Ht 5' 5 (1.651 m)   Wt 263 lb 6.4 oz (119.5 kg)   LMP 10/18/2023   BMI 43.83 kg/m   Wt Readings from Last 3 Encounters:  11/22/23 263 lb 6.4 oz (119.5 kg)  04/20/23 259 lb (117.5 kg)  08/18/22 236 lb 9.6 oz (107.3 kg)    General appearance: alert, no distress, well developed, well nourished Heart: RRR, normal S1, S2, no murmurs Lungs: CTA bilaterally, no wheezes, rhonchi, or rales Pulses: 2+ radial pulses, 2+ pedal pulses, normal cap refill Ext: no edema   Assessment: Encounter Diagnoses  Name Primary?   Vitamin D  deficiency Yes   Need for influenza vaccination    Need for COVID-19 vaccine    BMI 40.0-44.9, adult (HCC)    B12 deficiency    Goiter    Screening for diabetes mellitus      Plan: Begin back on Wegovy .  Discussed risk and benefits and proper use of medication.  We discussed exercise, diet measures.  She will be using health coach through the insurance program.  We discussed goalsetting and some fitness goals  Counseled on the influenza virus vaccine.  Vaccine information sheet given.  Influenza vaccine given after consent obtained.  Counseled on the Covid virus vaccine.  Vaccine information sheet given.  Covid vaccine given after consent obtained.   Faye was seen today for consult.  Diagnoses and all orders for this visit:  Vitamin D  deficiency -     VITAMIN D  25 Hydroxy (Vit-D Deficiency, Fractures); Future -     Basic metabolic panel; Future  Need for influenza vaccination -     Flu vaccine trivalent PF, 6mos and older(Flulaval,Afluria,Fluarix,Fluzone)  Need for COVID-19 vaccine -     Pfizer Comirnaty Covid -19 Vaccine 36yrs and older  BMI 40.0-44.9, adult (HCC) -     Hemoglobin A1c; Future  B12 deficiency -     Vitamin B12; Future  Goiter  Screening for diabetes mellitus -      Hemoglobin A1c; Future -     Basic metabolic panel; Future  Other orders -     Semaglutide -Weight Management (WEGOVY ) 0.25 MG/0.5ML SOAJ; Inject 0.25 mg into the skin once a week. -     Semaglutide -Weight Management (WEGOVY ) 0.5 MG/0.5ML SOAJ; Inject 0.5 mg into the skin once a week. -     cyanocobalamin  (VITAMIN B12) 1000 MCG tablet; Take 1 tablet (1,000 mcg total) by mouth daily.   Follow up: 23mo

## 2023-11-24 ENCOUNTER — Other Ambulatory Visit (HOSPITAL_COMMUNITY): Payer: Self-pay

## 2023-11-24 ENCOUNTER — Telehealth: Payer: Self-pay

## 2023-11-24 NOTE — Telephone Encounter (Signed)
 Pharmacy Patient Advocate Encounter   Received notification from CoverMyMeds that prior authorization for Wegovy  is required/requested.   Insurance verification completed.   The patient is insured through [facility] .   Per test claim: PA required; PA submitted to above mentioned insurance via CoverMyMeds Key/confirmation #/EOC (Key: [id number])   Status is pending

## 2023-11-25 ENCOUNTER — Other Ambulatory Visit (HOSPITAL_COMMUNITY): Payer: Self-pay

## 2023-11-25 NOTE — Telephone Encounter (Signed)
 Pharmacy Patient Advocate Encounter  Received notification from [facility] that Prior Authorization for Wegovy  has been APPROVED from 1.9.24 to 8.9.25. This test claim was processed through [facility]- copay amounts may vary at other pharmacies due to pharmacy/plan contracts, or as the patient moves through the different stages of their insurance plan.   PA #/Case ID/Reference #: (Key: [id number])

## 2024-01-10 ENCOUNTER — Ambulatory Visit: Payer: No Typology Code available for payment source | Admitting: Medical

## 2024-02-24 ENCOUNTER — Encounter (HOSPITAL_COMMUNITY): Payer: Self-pay | Admitting: *Deleted

## 2024-02-24 ENCOUNTER — Emergency Department (HOSPITAL_COMMUNITY)
Admission: EM | Admit: 2024-02-24 | Discharge: 2024-02-24 | Disposition: A | Discharge status: Home / Self Care | Attending: Emergency Medicine | Admitting: Emergency Medicine

## 2024-02-24 ENCOUNTER — Emergency Department (HOSPITAL_COMMUNITY)

## 2024-02-24 ENCOUNTER — Other Ambulatory Visit: Payer: Self-pay

## 2024-02-24 DIAGNOSIS — S29019A Strain of muscle and tendon of unspecified wall of thorax, initial encounter: Secondary | ICD-10-CM

## 2024-02-24 DIAGNOSIS — M549 Dorsalgia, unspecified: Secondary | ICD-10-CM | POA: Diagnosis present

## 2024-02-24 DIAGNOSIS — X58XXXA Exposure to other specified factors, initial encounter: Secondary | ICD-10-CM | POA: Insufficient documentation

## 2024-02-24 DIAGNOSIS — S29012A Strain of muscle and tendon of back wall of thorax, initial encounter: Secondary | ICD-10-CM | POA: Insufficient documentation

## 2024-02-24 NOTE — ED Triage Notes (Signed)
 Pt states for a week she has been having rt post back pain, concerned for cancer since friend had similar symptoms and died rapidly of cancer, cancer runs in family

## 2024-02-24 NOTE — Discharge Instructions (Addendum)
 Please use Tylenol or ibuprofen for pain.  You may use 600 mg ibuprofen every 6 hours or 1000 mg of Tylenol every 6 hours.  You may choose to alternate between the 2.  This would be most effective.  Not to exceed 4 g of Tylenol within 24 hours.  Not to exceed 3200 mg ibuprofen 24 hours.  Again I will give you a call when your x-ray finishes resulting if there is anything that I did not expect. I have very low clinical suspicion of a new cancerous finding as the explanation for your new back pain.  On my exam it seems muscular in nature.  If you continue to be concerned or you have other new or concerning symptoms I recommend that you follow-up with your primary care doctor for more definitive workup to rule out cancer.

## 2024-02-24 NOTE — ED Provider Notes (Signed)
 [place] EMERGENCY DEPARTMENT AT [facility] Provider Note   CSN: [id number] Arrival date & time: 02/24/24  1357     History  Chief Complaint  Patient presents with   Back Pain    Veronica Contreras is a 48 y.o. female with past medical history significant for obesity, GERD, hyperlipidemia who presents concern for right posterior back pain.  Patient reports that she had family friend who had a really aggressive kidney cancer and died 4 months and it presented similarly, she is worried that she could have some similar presentation.  She denies any recent weight loss, fever, chills.  She denies any dysuria, hematuria.  No previous history of kidney stones.  She rates the pain 8/10.  Reports it worsened yesterday.  She has tried some ibuprofen without significant relief.   Back Pain      Home Medications Prior to Admission medications   Medication Sig Start Date End Date Taking? Authorizing Provider  cyanocobalamin (VITAMIN B12) 1000 MCG tablet Take 1 tablet (1,000 mcg total) by mouth daily. 11/22/23   Tysinger, Kermit Balo, PA-C  Risankizumab-rzaa (SKYRIZI, 150 MG DOSE, [place]) Inject 150 mg into the skin every 3 (three) months.    [provider]  rosuvastatin (CRESTOR) 10 MG tablet TAKE 1 TABLET BY MOUTH EVERY OTHER DAY 06/27/23   Tysinger, Kermit Balo, PA-C  Semaglutide-Weight Management (WEGOVY) 0.25 MG/0.5ML SOAJ Inject 0.25 mg into the skin once a week. Patient not taking: Reported on 11/22/2023 04/20/23   Tysinger, Kermit Balo, PA-C  Semaglutide-Weight Management (WEGOVY) 0.25 MG/0.5ML SOAJ Inject 0.25 mg into the skin once a week. 11/22/23   Tysinger, Kermit Balo, PA-C  Semaglutide-Weight Management (WEGOVY) 0.5 MG/0.5ML SOAJ Inject 0.5 mg into the skin once a week. 11/22/23   Tysinger, Kermit Balo, PA-C  Vitamin D, Ergocalciferol, (DRISDOL) 1.25 MG (50000 UNIT) CAPS capsule Take 1 capsule (50,000 Units total) by mouth every 7 (seven) days. 04/21/23   Tysinger, Kermit Balo, PA-C      Allergies     Vicodin [hydrocodone-acetaminophen]    Review of Systems   Review of Systems  Musculoskeletal:  Positive for back pain.  All other systems reviewed and are negative.   Physical Exam Updated Vital Signs BP (!) 144/90 (BP Location: Left Arm)   Pulse 74   Temp 98.1 F (36.7 C) (Oral)   Resp 17   Ht 5\' 6"  (1.676 m)   Wt 116.6 kg   LMP 02/23/2024   SpO2 100%   BMI 41.48 kg/m  Physical Exam Vitals and nursing note reviewed.  Constitutional:      General: She is not in acute distress.    Appearance: Normal appearance.  HENT:     Head: Normocephalic and atraumatic.  Eyes:     General:        Right eye: No discharge.        Left eye: No discharge.  Cardiovascular:     Rate and Rhythm: Normal rate and regular rhythm.     Heart sounds: No murmur heard.    No friction rub. No gallop.  Pulmonary:     Effort: Pulmonary effort is normal.     Breath sounds: Normal breath sounds.  Abdominal:     General: Bowel sounds are normal.     Palpations: Abdomen is soft.     Comments: No significant anterior abdominal tenderness, suprapubic tenderness  Musculoskeletal:     Comments: Some thoracic paraspinous muscle ttp on the right, no midline or left sided  ttp. Normal strength 5/5 of bilateral upper and lower extremities.  Skin:    General: Skin is warm and dry.     Capillary Refill: Capillary refill takes less than 2 seconds.  Neurological:     Mental Status: She is alert and oriented to person, place, and time.  Psychiatric:        Mood and Affect: Mood normal.        Behavior: Behavior normal.     ED Results / Procedures / Treatments   Labs (all labs ordered are listed, but only abnormal results are displayed) Labs Reviewed - No data to display  EKG None  Radiology No results found.  Procedures Procedures    Medications Ordered in ED Medications - No data to display  ED Course/ Medical Decision Making/ A&P                                 Medical Decision  Making Amount and/or Complexity of Data Reviewed Radiology: ordered.   This patient is a 48 y.o. female  who presents to the ED for concern of back pain.   Differential diagnoses prior to evaluation: The emergent differential diagnosis includes, but is not limited to,  muscle strain, compression fracture, kidney stone, UTI, vs other, lower clinical suspicion given duration, exam for new intra-abdominal mass or bony tumor . This is not an exhaustive differential.   Past Medical History / Co-morbidities / Social History: obesity, GERD, hyperlipidemia  Physical Exam: Physical exam performed. The pertinent findings include: Some thoracic paraspinous muscle ttp on the right, no midline or left sided ttp. Normal strength 5/5 of bilateral upper and lower extremities.    No significant anterior abdominal tenderness, suprapubic tenderness   Lab Tests/Imaging studies: I independently interpreted imaging including plain film of the thoracic spine which shows no evidence of acute abnormality. Radiology interpretation pending at time of discharge, will reach out to patient if anything abnormal noted.   Medications: Recommend ibuprofen, tylenol, offered robaxin but patient declines  Disposition: After consideration of the diagnostic results and the patients response to treatment, I feel that patient with findings consistent with thoracic myofascial pain, no evidence of abnormality on plain xray, patient can follow up with PCP for more extensive cancer screening if she has worsening symptoms, develops B symptoms .   emergency department workup does not suggest an emergent condition requiring admission or immediate intervention beyond what has been performed at this time. The plan is: as above. The patient is safe for discharge and has been instructed to return immediately for worsening symptoms, change in symptoms or any other concerns.  Final Clinical Impression(s) / ED Diagnoses Final diagnoses:   Thoracic myofascial strain, initial encounter    Rx / DC Orders ED Discharge Orders     None         [place] 02/24/24 Suzan Garibaldi, MD 02/24/24 Paulo Fruit

## 2024-03-08 ENCOUNTER — Other Ambulatory Visit: Payer: Self-pay | Admitting: Medical

## 2024-03-08 DIAGNOSIS — Z1231 Encounter for screening mammogram for malignant neoplasm of breast: Secondary | ICD-10-CM

## 2024-04-20 ENCOUNTER — Ambulatory Visit: Payer: 59 | Admitting: Medical

## 2024-04-20 ENCOUNTER — Encounter: Payer: Self-pay | Admitting: Medical

## 2024-04-20 VITALS — BP 128/80 | HR 68 | Ht 66.0 in | Wt 264.0 lb

## 2024-04-20 DIAGNOSIS — M1990 Unspecified osteoarthritis, unspecified site: Secondary | ICD-10-CM

## 2024-04-20 DIAGNOSIS — E538 Deficiency of other specified B group vitamins: Secondary | ICD-10-CM | POA: Diagnosis not present

## 2024-04-20 DIAGNOSIS — E559 Vitamin D deficiency, unspecified: Secondary | ICD-10-CM

## 2024-04-20 DIAGNOSIS — Z0000 Encounter for general adult medical examination without abnormal findings: Secondary | ICD-10-CM | POA: Diagnosis not present

## 2024-04-20 DIAGNOSIS — L409 Psoriasis, unspecified: Secondary | ICD-10-CM

## 2024-04-20 DIAGNOSIS — E785 Hyperlipidemia, unspecified: Secondary | ICD-10-CM | POA: Diagnosis not present

## 2024-04-20 DIAGNOSIS — Z131 Encounter for screening for diabetes mellitus: Secondary | ICD-10-CM

## 2024-04-20 DIAGNOSIS — Z136 Encounter for screening for cardiovascular disorders: Secondary | ICD-10-CM

## 2024-04-20 DIAGNOSIS — E049 Nontoxic goiter, unspecified: Secondary | ICD-10-CM

## 2024-04-20 LAB — LIPID PANEL

## 2024-04-20 NOTE — Progress Notes (Signed)
 Subjective:   HPI  Veronica Contreras is a 48 y.o. female who presents for Chief Complaint  Patient presents with   Annual Exam    Annual phy. Pt is fasting/ goiter on neck has become more visible. Goes to GYN for pap      Patient Care Team: Swayze Pries, Christiane Cowing, PA-C as PCP - General (Family Medicine) Minnette Amato, NP as Nurse Practitioner (Nurse Practitioner) Olinda Bertrand, DO as Consulting Physician (Cardiology) Vinita Greenspan, PA-C as Consulting Physician (Rheumatology) Aleen Huron, MD as Consulting Physician (Dermatology) Virgina Grills, MD as Consulting Physician (Otolaryngology) Dentist  Eye doctor Dr. Minus Amel, [facility] gy[facility]   Concerns: Insurance wouldn't pay for B12 supplement so not currently using this.  Sees dermatology, on Skirizi  Hyperlipidemia - compliant with statin crestor  10mg  daily.   Vit D deficiency - has been using weekly prescription but ran out 2 weeks ago  Since being on Skirizi, arthritis pain is improved.   Reviewed their medical, surgical, family, social, medication, and allergy history and updated chart as appropriate.  Past Medical History:  Diagnosis Date   Acid reflux    B12 deficiency 10/2020   Ectopic pregnancy    Foot fracture, right    prior   Goiter    Psoriasis    nails   Vitamin D  deficiency     Family History  Problem Relation Age of Onset   Hypertension Mother    Diabetes Mother        complication of medication   Arthritis Brother    Breast cancer Maternal Aunt    Thyroid  disease Maternal Aunt    Heart disease Maternal Uncle        died of MI   Heart disease Maternal Uncle        cabg   Stroke Maternal Grandmother    Aneurysm Paternal Grandmother    Dementia Paternal Grandmother    Heart disease Paternal Grandfather        CABG   Thyroid  disease Cousin      Current Outpatient Medications:    Risankizumab-rzaa (SKYRIZI, 150 MG DOSE, [place]), Inject 150 mg into the skin every 3 (three) months.,  Disp: , Rfl:    rosuvastatin  (CRESTOR ) 10 MG tablet, TAKE 1 TABLET BY MOUTH EVERY OTHER DAY, Disp: 45 tablet, Rfl: 1   cyanocobalamin  (VITAMIN B12) 1000 MCG tablet, Take 1 tablet (1,000 mcg total) by mouth daily. (Patient not taking: Reported on 04/20/2024), Disp: 90 tablet, Rfl: 1   Semaglutide -Weight Management (WEGOVY ) 0.25 MG/0.5ML SOAJ, Inject 0.25 mg into the skin once a week. (Patient not taking: Reported on 04/20/2024), Disp: 2 mL, Rfl: 0   Semaglutide -Weight Management (WEGOVY ) 0.25 MG/0.5ML SOAJ, Inject 0.25 mg into the skin once a week. (Patient not taking: Reported on 04/20/2024), Disp: 2 mL, Rfl: 0   Semaglutide -Weight Management (WEGOVY ) 0.5 MG/0.5ML SOAJ, Inject 0.5 mg into the skin once a week. (Patient not taking: Reported on 04/20/2024), Disp: 2 mL, Rfl: 0   Vitamin D , Ergocalciferol , (DRISDOL ) 1.25 MG (50000 UNIT) CAPS capsule, Take 1 capsule (50,000 Units total) by mouth every 7 (seven) days. (Patient not taking: Reported on 04/20/2024), Disp: 12 capsule, Rfl: 3  Allergies  Allergen Reactions   Vicodin [Hydrocodone-Acetaminophen] Nausea And Vomiting    Review of Systems  Constitutional:  Negative for chills, fever, malaise/fatigue and weight loss.  HENT:  Negative for congestion, ear pain, hearing loss, sore throat and tinnitus.   Eyes:  Negative for blurred vision, pain and redness.  Respiratory:  Negative for cough, hemoptysis and shortness of breath.   Cardiovascular:  Negative for chest pain, palpitations, orthopnea, claudication and leg swelling.  Gastrointestinal:  Negative for abdominal pain, blood in stool, constipation, diarrhea, nausea and vomiting.  Genitourinary:  Negative for dysuria, flank pain, frequency, hematuria and urgency.  Musculoskeletal:  Negative for falls, joint pain and myalgias.  Skin:  Negative for itching and rash.  Neurological:  Negative for dizziness, tingling, speech change, weakness and headaches.  Endo/Heme/Allergies:  Negative for polydipsia. Does  not bruise/bleed easily.  Psychiatric/Behavioral:  Negative for depression and memory loss. The patient is not nervous/anxious and does not have insomnia.       Objective:  BP 128/80   Pulse 68   Ht 5\' 6"  (1.676 m)   Wt 264 lb (119.7 kg)   LMP 03/26/2024   SpO2 98%   BMI 42.61 kg/m   Wt Readings from Last 3 Encounters:  04/20/24 264 lb (119.7 kg)  02/24/24 257 lb (116.6 kg)  11/22/23 263 lb 6.4 oz (119.5 kg)   BP Readings from Last 3 Encounters:  04/20/24 128/80  02/24/24 136/88  11/22/23 128/84    General appearance: alert, no distress, WD/WN, African American female  Skin: tattoo right upper and lower arms separately, upper and lower HEENT: normocephalic, conjunctiva/corneas normal, sclerae anicteric, PERRLA, EOMi Neck: supple, no lymphadenopathy, generalized thyromegaly goiter, no specific nodules, no masses, normal ROM, no bruits Chest: non tender, normal shape and expansion Heart: RRR, normal S1, S2, no murmurs Lungs: CTA bilaterally, no wheezes, rhonchi, or rales Abdomen: +bs, soft, non tender, non distended, no masses, no hepatomegaly, no splenomegaly, no bruits Back: non tender, normal ROM, no scoliosis Musculoskeletal: upper extremities non tender, no obvious deformity, normal ROM throughout, lower extremities non tender, no obvious deformity, normal ROM throughout Extremities: no edema, no cyanosis, no clubbing Pulses: 2+ symmetric, upper and lower extremities, normal cap refill Neurological: alert, oriented x 3, CN2-12 intact, strength normal upper extremities and lower extremities, sensation normal throughout, DTRs 2+ throughout, no cerebellar signs, gait normal Psychiatric: normal affect, behavior normal, pleasant  Breast/gyn/rectal - deferred     Assessment and Plan :   Encounter Diagnoses  Name Primary?   Encounter for health maintenance examination in adult Yes   B12 deficiency    Vitamin D  deficiency    Hyperlipidemia, unspecified hyperlipidemia type     Psoriasis    Goiter    Arthritis    Screening for diabetes mellitus    Screening for heart disease      Today you had a preventative care visit or wellness visit.    Topics today may have included healthy lifestyle, diet, exercise, preventative care, vaccinations, sick and well care, proper use of emergency dept and after hours care, as well as other concerns.     Recommendations: Continue to return yearly for your annual wellness and preventative care visits.  This gives us  a chance to discuss healthy lifestyle, exercise, vaccinations, review your chart record, and perform screenings where appropriate.  I recommend you see your eye doctor yearly for routine vision care.  I recommend you see your dentist yearly for routine dental care including hygiene visits twice yearly.   Vaccination recommendations were reviewed Immunization History  Administered Date(s) Administered   Influenza Inj Mdck Quad Pf 12/11/2022   Influenza Split 09/09/2019   Influenza, Seasonal, Injecte, Preservative Fre 11/22/2023   Influenza,inj,Quad PF,6+ Mos 09/09/2019, 02/03/2021, 08/18/2022   Influenza-Unspecified 09/09/2019   PFIZER(Purple Top)SARS-COV-2 Vaccination 07/29/2020, 08/19/2020   PPD  Test 09/09/2019, 09/09/2019   Pfizer(Comirnaty)Fall Seasonal Vaccine 12 years and older 12/11/2022, 11/22/2023   Td 12/13/2007, 02/03/2021    Screening for cancer: Breast cancer screening: You should perform a self breast exam monthly.   We reviewed recommendations for regular mammograms and breast cancer screening.  Colon cancer screening:  Reviewed 06/2022 colonoscopy, repeat 10 years  Cervical cancer screening: We reviewed recommendations for pap smear screening.  Pap up to date 2024  Skin cancer screening: Check your skin regularly for new changes, growing lesions, or other lesions of concern Come in for evaluation if you have skin lesions of concern.  Lung cancer screening: If you have a greater  than 30 pack year history of tobacco use, then you qualify for lung cancer screening with a chest CT scan  We currently don't have screenings for other cancers besides breast, cervical, colon, and lung cancers.  If you have a strong family history of cancer or have other cancer screening concerns, please let me know.    Bone health: Get at least 150 minutes of aerobic exercise weekly Get weight bearing exercise at least once weekly You may need to have a bone density in your mid 50s due to history of fracture as and adult and vitamin D  deficiency   Heart health: Get at least 150 minutes of aerobic exercise weekly Limit alcohol It is important to maintain a healthy blood pressure and healthy cholesterol numbers    Separate significant issues discussed: Obesity  She is working with a Psychologist, educational.  Continue efforts with healthy eating habits and regular exercise  B12 deficiency diagnosed December 2021- updated lab today  Vitamin D  deficiency-labs today, will likely continue supplement weekly  Goiter-reivewed benign biopsies from last year 2023.  Updated thyroid  labs today.  She may want to consider ENT consult for surgery for debulking  Arthritis- doing well in this regard on diabetes.  Psoriasis - sees derm, doing well on Odeal Welden was seen today for annual exam.  Diagnoses and all orders for this visit:  Encounter for health maintenance examination in adult -     Comprehensive metabolic panel with GFR -     CBC with Differential/Platelet -     Lipid panel -     TSH + free T4 -     T3 -     Hemoglobin A1c -     VITAMIN D  25 Hydroxy (Vit-D Deficiency, Fractures) -     Urinalysis, Routine w reflex microscopic -     Vitamin B12 -     CT CARDIAC SCORING (SELF PAY ONLY); Future  B12 deficiency -     Vitamin B12  Vitamin D  deficiency -     VITAMIN D  25 Hydroxy (Vit-D Deficiency, Fractures)  Hyperlipidemia, unspecified hyperlipidemia type -     Lipid panel -     CT  CARDIAC SCORING (SELF PAY ONLY); Future  Psoriasis  Goiter -     TSH + free T4 -     T3  Arthritis  Screening for diabetes mellitus -     Hemoglobin A1c  Screening for heart disease -     CT CARDIAC SCORING (SELF PAY ONLY); Future    Follow-up pending labs

## 2024-04-21 LAB — URINALYSIS, ROUTINE W REFLEX MICROSCOPIC
Bilirubin, UA: NEGATIVE
Glucose, UA: NEGATIVE
Ketones, UA: NEGATIVE
Leukocytes,UA: NEGATIVE
Nitrite, UA: NEGATIVE
Protein,UA: NEGATIVE
RBC, UA: NEGATIVE
Specific Gravity, UA: 1.022 (ref 1.005–1.030)
Urobilinogen, Ur: 0.2 mg/dL (ref 0.2–1.0)
pH, UA: 5.5 (ref 5.0–7.5)

## 2024-04-22 ENCOUNTER — Ambulatory Visit: Payer: Self-pay | Admitting: Medical

## 2024-04-22 ENCOUNTER — Other Ambulatory Visit: Payer: Self-pay | Admitting: Medical

## 2024-04-22 DIAGNOSIS — E049 Nontoxic goiter, unspecified: Secondary | ICD-10-CM

## 2024-04-22 LAB — CBC WITH DIFFERENTIAL/PLATELET
Basophils Absolute: 0 10*3/uL (ref 0.0–0.2)
Basos: 1 %
EOS (ABSOLUTE): 0.2 10*3/uL (ref 0.0–0.4)
Eos: 3 %
Hematocrit: 41.4 % (ref 34.0–46.6)
Hemoglobin: 12.8 g/dL (ref 11.1–15.9)
Immature Grans (Abs): 0 10*3/uL (ref 0.0–0.1)
Immature Granulocytes: 0 %
Lymphocytes Absolute: 2.5 10*3/uL (ref 0.7–3.1)
Lymphs: 43 %
MCH: 30.3 pg (ref 26.6–33.0)
MCHC: 30.9 g/dL — ABNORMAL LOW (ref 31.5–35.7)
MCV: 98 fL — ABNORMAL HIGH (ref 79–97)
Monocytes Absolute: 0.6 10*3/uL (ref 0.1–0.9)
Monocytes: 11 %
Neutrophils Absolute: 2.4 10*3/uL (ref 1.4–7.0)
Neutrophils: 42 %
Platelets: 287 10*3/uL (ref 150–450)
RBC: 4.22 x10E6/uL (ref 3.77–5.28)
RDW: 14 % (ref 11.7–15.4)
WBC: 5.8 10*3/uL (ref 3.4–10.8)

## 2024-04-22 LAB — HEMOGLOBIN A1C
Est. average glucose Bld gHb Est-mCnc: 111 mg/dL
Hgb A1c MFr Bld: 5.5 % (ref 4.8–5.6)

## 2024-04-22 LAB — COMPREHENSIVE METABOLIC PANEL WITH GFR
ALT: 15 IU/L (ref 0–32)
AST: 17 IU/L (ref 0–40)
Albumin: 4.4 g/dL (ref 3.9–4.9)
Alkaline Phosphatase: 96 IU/L (ref 44–121)
BUN/Creatinine Ratio: 17 (ref 9–23)
BUN: 12 mg/dL (ref 6–24)
Bilirubin Total: 0.4 mg/dL (ref 0.0–1.2)
CO2: 17 mmol/L — ABNORMAL LOW (ref 20–29)
Calcium: 9.1 mg/dL (ref 8.7–10.2)
Chloride: 104 mmol/L (ref 96–106)
Creatinine, Ser: 0.69 mg/dL (ref 0.57–1.00)
Globulin, Total: 2.8 g/dL (ref 1.5–4.5)
Glucose: 85 mg/dL (ref 70–99)
Potassium: 4.4 mmol/L (ref 3.5–5.2)
Sodium: 136 mmol/L (ref 134–144)
Total Protein: 7.2 g/dL (ref 6.0–8.5)
eGFR: 108 mL/min/{1.73_m2} (ref >59)

## 2024-04-22 LAB — T3: T3, Total: 133 ng/dL (ref 71–180)

## 2024-04-22 LAB — LIPID PANEL
Chol/HDL Ratio: 2.9 ratio (ref 0.0–4.4)
Cholesterol, Total: 149 mg/dL (ref 100–199)
HDL: 52 mg/dL (ref >39)
LDL Chol Calc (NIH): 79 mg/dL (ref 0–99)
Triglycerides: 96 mg/dL (ref 0–149)
VLDL Cholesterol Cal: 18 mg/dL (ref 5–40)

## 2024-04-22 LAB — TSH+FREE T4
Free T4: 1.06 ng/dL (ref 0.82–1.77)
TSH: 1.62 u[IU]/mL (ref 0.450–4.500)

## 2024-04-22 LAB — VITAMIN D 25 HYDROXY (VIT D DEFICIENCY, FRACTURES): Vit D, 25-Hydroxy: 42.3 ng/mL (ref 30.0–100.0)

## 2024-04-22 LAB — VITAMIN B12: Vitamin B-12: 416 pg/mL (ref 232–1245)

## 2024-04-22 MED ORDER — VITAMIN D (ERGOCALCIFEROL) 1.25 MG (50000 UNIT) PO CAPS: 50000.0000 [IU] | ORAL_CAPSULE | ORAL | 3 refills | Status: AC

## 2024-04-22 MED ORDER — ROSUVASTATIN CALCIUM 10 MG PO TABS: 10.0000 mg | ORAL_TABLET | ORAL | 2 refills | Status: DC

## 2024-04-22 MED ORDER — VITAMIN B-12 1000 MCG PO TABS: 1000.0000 ug | ORAL_TABLET | Freq: Every day | ORAL | 2 refills | Status: AC

## 2024-04-22 NOTE — Progress Notes (Signed)
 Results sent through MyChart

## 2024-05-08 ENCOUNTER — Ambulatory Visit (HOSPITAL_COMMUNITY)
Admission: RE | Admit: 2024-05-08 | Discharge: 2024-05-08 | Disposition: A | Discharge status: Home / Self Care | Payer: Self-pay | Source: Ambulatory Visit | Attending: Medical | Admitting: Medical

## 2024-05-08 ENCOUNTER — Encounter (HOSPITAL_COMMUNITY): Payer: Self-pay

## 2024-05-08 DIAGNOSIS — Z0000 Encounter for general adult medical examination without abnormal findings: Secondary | ICD-10-CM | POA: Insufficient documentation

## 2024-05-08 DIAGNOSIS — E785 Hyperlipidemia, unspecified: Secondary | ICD-10-CM | POA: Insufficient documentation

## 2024-05-08 DIAGNOSIS — Z136 Encounter for screening for cardiovascular disorders: Secondary | ICD-10-CM | POA: Insufficient documentation

## 2024-05-09 NOTE — Progress Notes (Signed)
 Results sent through MyChart

## 2024-06-12 ENCOUNTER — Other Ambulatory Visit: Payer: Self-pay | Admitting: Medical

## 2024-06-20 ENCOUNTER — Ambulatory Visit: Payer: Self-pay

## 2024-06-20 NOTE — Telephone Encounter (Signed)
 FYI Only or Action Required?: FYI only for provider.  Patient was last seen in primary care on 04/20/2024 by Bulah Alm RAMAN, PA-C.  Called Nurse Triage reporting Leg Swelling.  Symptoms began x 2 weeks.  Interventions attempted: Rest, hydration, or home remedies.  Symptoms are: gradually worsening.  Triage Disposition: See Physician Within 24 Hours  Patient/caregiver understands and will follow disposition?: Yes    Copied from CRM #[id number]. Topic: Clinical - Red Word Triage >> Jun 20, 2024 11:02 AM Jasmin G wrote: [facility] that prompted transfer to Nurse Triage: Pt's ankles have been swollen for about 2 weeks accompanied by a burning sensation, pt states that when she touched her ankles a dent was left and she got concerned Reason for Disposition  [1] MODERATE leg swelling (e.g., swelling extends up to knees) AND [2] new-onset or getting worse  Answer Assessment - Initial Assessment Questions 1. ONSET: When did the swelling start? (e.g., minutes, hours, days)     X 2 weeks 2. LOCATION: What part of the leg is swollen?  Are both legs swollen or just one leg?     Bilateral ankles 3. SEVERITY: How bad is the swelling? (e.g., localized; mild, moderate, severe)     Mild to moderate 4. REDNESS: Is there redness or signs of infection?     no 5. PAIN: Is the swelling painful to touch? If Yes, ask: How painful is it?   (Scale 1-10; mild, moderate or severe)     No pain just burning sensation 6. FEVER: Do you have a fever? If Yes, ask: What is it, how was it measured, and when did it start?      no 7. CAUSE: What do you think is causing the leg swelling?     unknown 8. MEDICAL HISTORY: Do you have a history of blood clots (e.g., DVT), cancer, heart failure, kidney disease, or liver failure?     na 9. RECURRENT SYMPTOM: Have you had leg swelling before? If Yes, ask: When was the last time? What happened that time?     no 10. OTHER SYMPTOMS: Do you have any  other symptoms? (e.g., chest pain, difficulty breathing)       Tightness in bilateral ankles 11. PREGNANCY: Is there any chance you are pregnant? When was your last menstrual period?       na  Protocols used: Leg Swelling and Edema-A-AH

## 2024-06-21 ENCOUNTER — Ambulatory Visit (INDEPENDENT_AMBULATORY_CARE_PROVIDER_SITE_OTHER): Admitting: Medical

## 2024-06-21 VITALS — BP 110/70 | HR 67 | Temp 98.0°F | Wt 274.4 lb

## 2024-06-21 DIAGNOSIS — G479 Sleep disorder, unspecified: Secondary | ICD-10-CM | POA: Diagnosis not present

## 2024-06-21 DIAGNOSIS — G4700 Insomnia, unspecified: Secondary | ICD-10-CM

## 2024-06-21 DIAGNOSIS — R252 Cramp and spasm: Secondary | ICD-10-CM | POA: Diagnosis not present

## 2024-06-21 DIAGNOSIS — R609 Edema, unspecified: Secondary | ICD-10-CM | POA: Diagnosis not present

## 2024-06-21 NOTE — Progress Notes (Signed)
 Subjective:  Veronica Contreras is a 48 y.o. female who presents for Chief Complaint  Patient presents with   Acute Visit    Bilateral ankle pain with swelling. Been going on about 2 months     Here for some ankle swelling for about 2 months.  Been getting burning sensation in both legs. Bottom of foot looks sunk in.  She can sometimes touch ankles or lower leg and leave indentation.     Swelling has resolved in the mornings after sleeping and elevating legs.   She googles symptoms and got concerned.    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  Gets up some to move around but mostly seated for 6 hours per day.  Not currently wearing compression socks.  Also having some problems getting to and staying asleep.  No prior treatment for this.     No other aggravating or relieving factors.    No other c/o.  Past Medical History:  Diagnosis Date   Acid reflux    B12 deficiency 10/2020   Ectopic pregnancy    Foot fracture, right    prior   Goiter    Psoriasis    nails   Vitamin D  deficiency    Current Outpatient Medications on File Prior to Visit  Medication Sig Dispense Refill   cyanocobalamin  (VITAMIN B12) 1000 MCG tablet Take 1 tablet (1,000 mcg total) by mouth daily. 90 tablet 2   Risankizumab-rzaa (SKYRIZI, 150 MG DOSE, [place]) Inject 150 mg into the skin every 3 (three) months.     rosuvastatin  (CRESTOR ) 10 MG tablet TAKE 1 TABLET BY MOUTH EVERY OTHER DAY 45 tablet 1   Vitamin D , Ergocalciferol , (DRISDOL ) 1.25 MG (50000 UNIT) CAPS capsule Take 1 capsule (50,000 Units total) by mouth every 7 (seven) days. 12 capsule 3   No current facility-administered medications on file prior to visit.     The following portions of the patient's history were reviewed and updated as appropriate: allergies, current medications, past family history, past medical history, past social history, past surgical history and problem  list.  ROS Otherwise as in subjective above    Objective: BP 110/70   Pulse 67   Temp 98 F (36.7 C)   Wt 274 lb 6.4 oz (124.5 kg)   BMI 44.29 kg/m   Wt Readings from Last 3 Encounters:  06/21/24 274 lb 6.4 oz (124.5 kg)  04/20/24 264 lb (119.7 kg)  02/24/24 257 lb (116.6 kg)   BP Readings from Last 3 Encounters:  06/21/24 110/70  04/20/24 128/80  02/24/24 136/88     General appearance: alert, no distress, well developed, well nourished Neck: supple, no lymphadenopathy, no thyromegaly, no masses, no JVD Heart: RRR, normal S1, S2, no murmurs Lungs: CTA bilaterally, no wheezes, rhonchi, or rales Pulses: 2+ radial pulses, 2+ pedal pulses, normal cap refill Ext: 1+ bilat pitting ankle edema, otherwise no edema   Assessment: Encounter Diagnoses  Name Primary?   Dependent edema Yes   Sleep disturbance    Leg cramping    Insomnia, unspecified type      Plan: Dependent edema-I reviewed recent labs she had 2 months ago, she had an ultrasound of her heart 2022 completely normal.  CT coronary test June 2025 with calcium  score of 0.  Her symptoms most likely related to prolonged sitting in her job.  Advise she get up and move around more during her working hours,  get exercise regularly, work on losing weight.  Begin wearing compression hose during the day while working particular with prolonged sitting.  We discussed where she can get some over-the-counter compression hose.  Limit salt.  Elevate legs when possible  Sleep disturbance, insomnia-counseled on sleep hygiene.-If she feels like she needs to use a sleep aid to start with either ZzzQuil or sleepy time tea over-the-counter.  Leg cramping-counseled on diet, water intake, good body vegetable and fruit intake.  If this continues we will check some labs.  [name] was seen today for acute visit.  Diagnoses and all orders for this visit:  Dependent edema  Sleep disturbance  Leg cramping  Insomnia, unspecified  type    Follow up: prn

## 2024-07-02 ENCOUNTER — Telehealth (INDEPENDENT_AMBULATORY_CARE_PROVIDER_SITE_OTHER): Payer: Self-pay | Admitting: Otolaryngology

## 2024-07-02 NOTE — Telephone Encounter (Signed)
 LVM to confirm appt & location [id number] afm

## 2024-07-03 ENCOUNTER — Ambulatory Visit (INDEPENDENT_AMBULATORY_CARE_PROVIDER_SITE_OTHER): Admitting: Otolaryngology

## 2024-07-03 ENCOUNTER — Encounter (INDEPENDENT_AMBULATORY_CARE_PROVIDER_SITE_OTHER): Payer: Self-pay | Admitting: Otolaryngology

## 2024-07-03 VITALS — BP 104/72 | HR 84

## 2024-07-03 DIAGNOSIS — R29898 Other symptoms and signs involving the musculoskeletal system: Secondary | ICD-10-CM | POA: Diagnosis not present

## 2024-07-03 DIAGNOSIS — L89899 Pressure ulcer of other site, unspecified stage: Secondary | ICD-10-CM

## 2024-07-03 DIAGNOSIS — E042 Nontoxic multinodular goiter: Secondary | ICD-10-CM

## 2024-07-03 DIAGNOSIS — R09A2 Foreign body sensation, throat: Secondary | ICD-10-CM

## 2024-07-03 NOTE — Patient Instructions (Signed)
What to Expect During a Total Thyroidectomy Total thyroidectomy involves the removal of the entire thyroid gland. This procedure is most often performed to treat thyroid cancer, but it may also be performed to treat uncontrollable hyperthyroidism or goiter that causes severe symptoms.  If you're having thyroidectomy as a result of thyroid cancer, we may remove lymph nodes around your thyroid to be examined by a pathologist. We use the smallest incisions possible, to limit scarring to your neck and deliver the best possible cosmetic results. General anesthesia is used during a thyroidectomy, and you'll usually stay in the hospital for one night following the procedure.  After your procedure, you will need to take thyroid hormone for the rest of your life, because your thyroid gland will no longer supply you with the necessary hormone. Your primary care provider or endocrinologist will do blood tests to ensure that you are getting the right amount of thyroid hormone. You might also have to take supplements after thyroidectomy to balance your calcium levels.  In the weeks after your thyroidectomy, you may have neck pain, soreness of your vocal chords or a weak voice. These symptoms are usually temporary.  What to Expect During a Thyroid Lobectomy A thyroid lobectomy is used to remove one of your two thyroid lobes, leaving the other intact. We may perform this type of surgery if there are nodules that cause symptoms or could be cancerous. We also use it to treat excessive hormone production like that associated with hyperthyroidism.  If you are having a thyroid lobectomy because of an indeterminate biopsy result, we will send the tissues collected to a pathologist for examination. If cancer is found, you might have to undergo a second surgery to ensure that all of the cancerous tissue is removed.  A thyroid lobectomy is performed under general anesthesia and is often an outpatient procedure. However, if  you require a hospital stay, our nursing staff are experts in managing your post-operative care and transitioning you to continued recovery at home.  After a thyroid lobectomy, you'll need to have your thyroid levels checked. Depending on the results, you may need thyroid hormone replacement.  Who is a candidate for thyroidectomy versus thyroid lobectomy? In some cases, both a thyroidectomy and a thyroid lobectomy are treatment options. The surgery performed will depend of your preference, and we can help you choose what's best based on your specific needs.  In other cases, including the instances below, there is a clear indication that one surgical option is better than the other.  For example:  If you are taking thyroid hormone replacements or have several nodules on your thyroid, it's usually suggested that you have a thyroidectomy. If you have diffuse thyroiditis -- inflammation of the thyroid gland that causes hypothyroidism -- a toxic nodule or one specific nodule that needs to be removed, a thyroid lobectomy is often the treatment of choice. If, after a thyroid biopsy, a pathologist cannot reach a conclusion on whether a nodule is cancerous, a lobectomy is often considered. About 20 percent of thyroid biopsies result in indeterminate test results. The tissue removed in the lobectomy will then be examined by a pathologist. If cancer is found, you might have to undergo a second surgery to ensure all of the cancerous tissue is removed.

## 2024-07-03 NOTE — Progress Notes (Signed)
 ENT CONSULT:  Reason for Consult: multinodular thyroid  goiter   HPI: Discussed the use of AI scribe software for clinical note transcription with the patient, who gave verbal consent to proceed.  History of Present Illness Veronica Contreras is a 48 year old female with multiple thyroid  nodules who presents with a sensation of a lump in her throat, pressure on her neck and noticeable anterior neck enlargement in the area of thyroid  gland.   She has a history of multiple thyroid  nodules that were biopsied in 2023, with results indicating they were benign. No recent ultrasounds have been performed since the biopsy.  She experiences a sensation of a lump in her throat, described as 'a feeling of something being stuck'. This sensation does not interfere with swallowing. She has observed that her thyroid  is more visible now than before and she has some neck pressure.   Normal TFTs before.    Records Reviewed:  PCP visit Veronica Gent, PA-C ere for some ankle swelling for about 2 months.  Been getting burning sensation in both legs. Bottom of foot looks sunk in.  She can sometimes touch ankles or lower leg and leave indentation.      Swelling has resolved in the mornings after sleeping and elevating legs.   A/P  Dependent edema-I reviewed recent labs she had 2 months ago, she had an ultrasound of her heart 2022 completely normal.  CT coronary test June 2025 with calcium  score of 0.  Her symptoms most likely related to prolonged sitting in her job.  Advise she get up and move around more during her working hours, get exercise regularly, work on losing weight.  Begin wearing compression hose during the day while working particular with prolonged sitting.  We discussed where she can get some over-the-counter compression hose.  Limit salt.  Elevate legs when possible   Sleep disturbance, insomnia-counseled on sleep hygiene.-If she feels like she needs to use a sleep aid to start with either ZzzQuil or  sleepy time tea over-the-counter.   Leg cramping-counseled on diet, water intake, good body vegetable and fruit intake.  If this continues we will check some labs.   [name] was seen today for acute visit.   Diagnoses and all orders for this visit:   Dependent edema   Sleep disturbance   Leg cramping   Insomnia, unspecified type    Past Medical History:  Diagnosis Date   Acid reflux    B12 deficiency 10/2020   Ectopic pregnancy    Foot fracture, right    prior   Goiter    Psoriasis    nails   Vitamin D  deficiency     Past Surgical History:  Procedure Laterality Date   COLONOSCOPY  2015   Dr. Renaye Sous   COLONOSCOPY  06/2022   diverticula, othewrise normal, repeat 10 years, Dr. Sous   LAPAROSCOPY FOR ECTOPIC PREGNANCY     Salpingectomy   SALPINGECTOMY     R/T ectopic pregnancy    Family History  Problem Relation Age of Onset   Hypertension Mother    Diabetes Mother        complication of medication   Arthritis Brother    Breast cancer Maternal Aunt    Thyroid  disease Maternal Aunt    Heart disease Maternal Uncle        died of MI   Heart disease Maternal Uncle        cabg   Stroke Maternal Grandmother    Aneurysm Paternal Grandmother  Dementia Paternal Grandmother    Heart disease Paternal Grandfather        CABG   Thyroid  disease Cousin     Social History:  reports that she quit smoking about 4 years ago. Her smoking use included cigarettes. She has never used smokeless tobacco. She reports current alcohol use. She reports current drug use. Frequency: 1.00 time per week. Drug: Marijuana.  Allergies:  Allergies  Allergen Reactions   Vicodin [Hydrocodone-Acetaminophen] Nausea And Vomiting    Medications: I have reviewed the patient's current medications.  The PMH, PSH, Medications, Allergies, and SH were reviewed and updated.  ROS: Constitutional: Negative for fever, weight loss and weight gain. Cardiovascular: Negative for chest pain and  dyspnea on exertion. Respiratory: Is not experiencing shortness of breath at rest. Gastrointestinal: Negative for nausea and vomiting. Neurological: Negative for headaches. Psychiatric: The patient is not nervous/anxious  Blood pressure 104/72, pulse 84, SpO2 93%. There is no height or weight on file to calculate BMI.  PHYSICAL EXAM:  Exam: General: Well-developed, well-nourished Communication and Voice: Clear pitch and clarity Respiratory Respiratory effort: Equal inspiration and expiration without stridor Cardiovascular Peripheral Vascular: Warm extremities with equal color/perfusion Eyes: No nystagmus with equal extraocular motion bilaterally Neuro/Psych/Balance: Patient oriented to person, place, and time; Appropriate mood and affect; Gait is intact with no imbalance; Cranial nerves I-XII are intact Head and Face Inspection: Normocephalic and atraumatic without mass or lesion Palpation: Facial skeleton intact without bony stepoffs Salivary Glands: No mass or tenderness Facial Strength: Facial motility symmetric and full bilaterally ENT Pinna: External ear intact and fully developed External canal: Canal is patent with intact skin Tympanic Membrane: Clear and mobile External Nose: No scar or anatomic deformity Internal Nose: Septum is relatively straight. No polyp, or purulence. Mucosal edema and erythema present.  Bilateral inferior turbinate hypertrophy.  Lips, Teeth, and gums: Mucosa and teeth intact and viable TMJ: No pain to palpation with full mobility Oral cavity/oropharynx: No erythema or exudate, no lesions present Nasopharynx: No mass or lesion with intact mucosa Hypopharynx: Intact mucosa without pooling of secretions Larynx Glottic: Full true vocal cord mobility without lesion or mass Supraglottic: Normal appearing epiglottis and AE folds Interarytenoid Space: Moderate pachydermia&edema Subglottic Space: Patent without lesion or edema Neck Neck and Trachea:  Midline trachea without mass or lesion Thyroid : No mass or nodularity. Thyromegaly present on exam.  Lymphatics: No lymphadenopathy  Procedure: Preoperative diagnosis: globus, neck pressure, thyroid  goiter  Postoperative diagnosis:   Same  Procedure: Flexible fiberoptic laryngoscopy  Surgeon: Elena Larry, MD  Anesthesia: Topical lidocaine and Afrin Complications: None Condition is stable throughout exam  Indications and consent:  The patient presents to the clinic with above symptoms. Indirect laryngoscopy view was incomplete. Thus it was recommended that they undergo a flexible fiberoptic laryngoscopy. All of the risks, benefits, and potential complications were reviewed with the patient preoperatively and verbal informed consent was obtained.  Procedure: The patient was seated upright in the clinic. Topical lidocaine and Afrin were applied to the nasal cavity. After adequate anesthesia had occurred, I then proceeded to pass the flexible telescope into the nasal cavity. The nasal cavity was patent without rhinorrhea or polyp. The nasopharynx was also patent without mass or lesion. The base of tongue was visualized and was normal. There were no signs of pooling of secretions in the piriform sinuses. The true vocal folds were mobile bilaterally. There were no signs of glottic or supraglottic mucosal lesion or mass. There was moderate interarytenoid pachydermia and post cricoid edema.  The telescope was then slowly withdrawn and the patient tolerated the procedure throughout.    Studies Reviewed: Thyroid  U/S 05/11/22 Nodule labeled 1 is a solid isoechoic TR 3 nodule in the thyroid  isthmus measuring 1.1 x 0.9 x 0.7 cm. Given size (<1.4 cm) and appearance, this nodule does NOT meet TI-RADS criteria for biopsy or dedicated follow-up.   Nodule labeled 2 (previously 1) is a large solid isoechoic nodule with coarse macrocalcification in the inferior right thyroid  lobe that measures 5.1 x  4.1 x 2.7 cm, previously measuring up to 4.5 cm. This nodule was previously biopsied. It appears to have demonstrated a slight interval increase in size.   Nodule labeled 3 is a small subcentimeter solid hypoechoic TR 4 nodule in the posterior aspect of the mid left thyroid  lobe. Given size (<0.9 cm) and appearance, this nodule does NOT meet TI-RADS criteria for biopsy or dedicated follow-up.   Nodule labeled 4 is a predominantly solid nodule in the inferior left thyroid  lobe that measures 3.0 x 2.5 x 1.8 cm, previously measuring to 2.8 cm. This nodule was previously biopsied. It remains overall similar in size and morphology.   IMPRESSION: 1. Enlarged multinodular thyroid  gland. 2. Nodule labeled 2 (previously 1) in the inferior right thyroid  lobe appears to demonstrate slight interval enlargement measuring 5.1 cm on today's exam, previously 4.5 cm in March 2022. This nodule was previously biopsied, correlate with biopsy results. 3. Nodule labeled 4 in the inferior left thyroid  lobe was previously biopsied, and remains overall similar in size and morphology.  FNA results Nodule 2 and 4 09/01/22 Benign Bethesda II nodules   Assessment/Plan: Encounter Diagnoses  Name Primary?   Multiple thyroid  nodules Yes   Pressure injury of back of neck    Globus sensation    Neck tightness     Assessment and Plan Assessment & Plan Multinodular goiter with compressive symptoms Multinodular goiter increasing in size with compressive symptoms. Previous benign biopsies of two nodules in 2023. No recent thyroid  U/S but she reports visible goiter (did not have that before).   Total thyroidectomy recommended due to compressive symptoms. Explained lifelong thyroid  hormone replacement. Discussed surgical procedure and risks and benefits, and she would like to proceed. - Order repeat thyroid  ultrasound. - Schedule total thyroidectomy. - Provided thyroidectomy information for home review. - Arrange  for post-operative calcium  supplementation.   Thank you for allowing me to participate in the care of this patient. Please do not hesitate to contact me with any questions or concerns.   Elena Larry, MD Otolaryngology [facility] Phone: [phone] Fax: [phone]    07/03/2024, 2:19 PM

## 2024-07-04 ENCOUNTER — Telehealth (INDEPENDENT_AMBULATORY_CARE_PROVIDER_SITE_OTHER): Payer: Self-pay

## 2024-07-04 ENCOUNTER — Telehealth (INDEPENDENT_AMBULATORY_CARE_PROVIDER_SITE_OTHER): Payer: Self-pay | Admitting: Otolaryngology

## 2024-07-04 NOTE — Telephone Encounter (Signed)
 Called patient back regarding question. Patient understood.

## 2024-07-04 NOTE — Telephone Encounter (Signed)
 Called patient back regarding voicemail that patient left. Patient wants to know what the amount of time is for her to be out of work.

## 2024-07-05 ENCOUNTER — Ambulatory Visit
Admission: RE | Admit: 2024-07-05 | Discharge: 2024-07-05 | Disposition: A | Discharge status: Home / Self Care | Source: Ambulatory Visit | Attending: Medical | Admitting: Medical

## 2024-07-05 DIAGNOSIS — Z1231 Encounter for screening mammogram for malignant neoplasm of breast: Secondary | ICD-10-CM

## 2024-07-06 ENCOUNTER — Ambulatory Visit (HOSPITAL_COMMUNITY)

## 2024-07-10 ENCOUNTER — Ambulatory Visit: Payer: Self-pay | Admitting: Medical

## 2024-07-10 ENCOUNTER — Ambulatory Visit
Admission: RE | Admit: 2024-07-10 | Discharge: 2024-07-10 | Disposition: A | Discharge status: Home / Self Care | Source: Ambulatory Visit | Attending: Otolaryngology | Admitting: Otolaryngology

## 2024-07-10 DIAGNOSIS — E042 Nontoxic multinodular goiter: Secondary | ICD-10-CM

## 2024-07-18 ENCOUNTER — Ambulatory Visit (INDEPENDENT_AMBULATORY_CARE_PROVIDER_SITE_OTHER): Payer: Self-pay

## 2024-07-18 NOTE — Telephone Encounter (Signed)
Spoke to patient regarding results. Patient understood.

## 2024-08-03 NOTE — Telephone Encounter (Signed)
 Done

## 2024-08-07 ENCOUNTER — Encounter (HOSPITAL_COMMUNITY): Payer: Self-pay | Admitting: *Deleted

## 2024-08-07 ENCOUNTER — Ambulatory Visit (HOSPITAL_COMMUNITY)
Admission: EM | Admit: 2024-08-07 | Discharge: 2024-08-07 | Disposition: A | Discharge status: Home / Self Care | Attending: Student | Admitting: Student

## 2024-08-07 ENCOUNTER — Other Ambulatory Visit: Payer: Self-pay

## 2024-08-07 DIAGNOSIS — R109 Unspecified abdominal pain: Secondary | ICD-10-CM | POA: Insufficient documentation

## 2024-08-07 DIAGNOSIS — S39012A Strain of muscle, fascia and tendon of lower back, initial encounter: Secondary | ICD-10-CM | POA: Diagnosis not present

## 2024-08-07 LAB — POCT URINALYSIS DIP (MANUAL ENTRY)
Bilirubin, UA: NEGATIVE
Glucose, UA: NEGATIVE mg/dL
Ketones, POC UA: NEGATIVE mg/dL
Leukocytes, UA: NEGATIVE
Nitrite, UA: NEGATIVE
Protein Ur, POC: NEGATIVE mg/dL
Spec Grav, UA: 1.015 (ref 1.010–1.025)
Urobilinogen, UA: 0.2 U/dL
pH, UA: 5.5 (ref 5.0–8.0)

## 2024-08-07 LAB — POCT URINE PREGNANCY: Preg Test, Ur: NEGATIVE

## 2024-08-07 NOTE — ED Provider Notes (Signed)
 [facility]    CSN: [id number] Arrival date & time: 08/07/24  1643      History   Chief Complaint Chief Complaint  Patient presents with   Abdominal Pain   Back Pain    HPI Veronica Contreras is a 48 y.o. female presenting with abdominal and back pain. -Abd pain x2 days, of bilateral lower quadrants, which feels like sore muscle, worse with movement. -Lumbar back pain x3 days, also worse with movement  Endorses decreased appetite. last BM was today and was typical for her. Denies recent heavy lifting.  Denies n/v/d. Denies cough, congestion.  Endorses irregular periods. LMP 8/15, she is about 2 days late for her current period.   H/o ectopic pregnancy 2008.   HPI  Past Medical History:  Diagnosis Date   Acid reflux    B12 deficiency 10/2020   Ectopic pregnancy    Foot fracture, right    prior   Goiter    Psoriasis    nails   Vitamin D  deficiency     Patient Active Problem List   Diagnosis Date Noted   Hyperlipidemia 06/10/2022   Screen for colon cancer 04/15/2022   Gastroesophageal reflux disease 02/05/2021   Encounter for health maintenance examination in adult 02/03/2021   Psoriatic arthritis (HCC) 02/03/2021   Sleep disturbance 02/03/2021   Snoring 02/03/2021   B12 deficiency 02/03/2021   BMI 40.0-44.9, adult (HCC) 02/03/2021   Vitamin D  deficiency 10/29/2020   Arthritis 10/29/2020   Psoriasis 10/29/2020   Goiter 10/29/2020    Past Surgical History:  Procedure Laterality Date   COLONOSCOPY  2015   Dr. Renaye Sous   COLONOSCOPY  06/2022   diverticula, othewrise normal, repeat 10 years, Dr. Sous   LAPAROSCOPY FOR ECTOPIC PREGNANCY     Salpingectomy   SALPINGECTOMY     R/T ectopic pregnancy    OB History     Gravida  1   Para      Term      Preterm      AB  1   Living  0      SAB      IAB      Ectopic  1   Multiple      Live Births               Home Medications    Prior to Admission medications    Medication Sig Start Date End Date Taking? Authorizing Provider  cyanocobalamin  (VITAMIN B12) 1000 MCG tablet Take 1 tablet (1,000 mcg total) by mouth daily. 04/22/24  Yes Tysinger, Alm RAMAN, PA-C  Risankizumab-rzaa (SKYRIZI, 150 MG DOSE, [place]) Inject 150 mg into the skin every 3 (three) months.   Yes [provider]  rosuvastatin  (CRESTOR ) 10 MG tablet TAKE 1 TABLET BY MOUTH EVERY OTHER DAY 06/12/24  Yes Tysinger, Alm RAMAN, PA-C  Vitamin D , Ergocalciferol , (DRISDOL ) 1.25 MG (50000 UNIT) CAPS capsule Take 1 capsule (50,000 Units total) by mouth every 7 (seven) days. 04/22/24  Yes Tysinger, Alm RAMAN, PA-C    Family History Family History  Problem Relation Age of Onset   Hypertension Mother    Diabetes Mother        complication of medication   Arthritis Brother    Breast cancer Maternal Aunt    Thyroid  disease Maternal Aunt    Heart disease Maternal Uncle        died of MI   Heart disease Maternal Uncle  cabg   Stroke Maternal Grandmother    Aneurysm Paternal Grandmother    Dementia Paternal Grandmother    Heart disease Paternal Grandfather        CABG   Thyroid  disease Cousin     Social History Social History   Tobacco Use   Smoking status: Former    Current packs/day: 0.00    Types: Cigarettes    Quit date: 2021    Years since quitting: 4.7   Smokeless tobacco: Never  Vaping Use   Vaping status: Never Used  Substance Use Topics   Alcohol use: Yes    Comment: some, social, maybe once monthly   Drug use: Yes    Frequency: 1.0 times per week    Types: Marijuana    Comment: occasionally     Allergies   Vicodin [hydrocodone-acetaminophen]   Review of Systems Review of Systems  Gastrointestinal:  Positive for abdominal pain.  Musculoskeletal:  Positive for back pain.     Physical Exam Triage Vital Signs ED Triage Vitals  Encounter Vitals Group     BP 08/07/24 1731 112/76     Girls Systolic BP Percentile --      Girls Diastolic BP Percentile --       Boys Systolic BP Percentile --      Boys Diastolic BP Percentile --      Pulse Rate 08/07/24 1731 66     Resp 08/07/24 1731 18     Temp 08/07/24 1731 98.5 F (36.9 C)     Temp src --      SpO2 08/07/24 1731 98 %     Weight --      Height --      Head Circumference --      Peak Flow --      Pain Score 08/07/24 1729 6     Pain Loc --      Pain Education --      Exclude from Growth Chart --    No data found.  Updated Vital Signs BP 112/76   Pulse 66   Temp 98.5 F (36.9 C)   Resp 18   LMP 06/26/2024 (Approximate) Comment: PT reports irregular periods  SpO2 98%   Visual Acuity Right Eye Distance:   Left Eye Distance:   Bilateral Distance:    Right Eye Near:   Left Eye Near:    Bilateral Near:     Physical Exam Vitals reviewed.  Constitutional:      General: She is not in acute distress.    Appearance: Normal appearance. She is not ill-appearing.  HENT:     Head: Normocephalic and atraumatic.  Cardiovascular:     Rate and Rhythm: Normal rate and regular rhythm.     Heart sounds: Normal heart sounds.  Pulmonary:     Effort: Pulmonary effort is normal.     Breath sounds: Normal breath sounds and air entry.  Abdominal:     Tenderness: There is abdominal tenderness (LLQ and RLQ are mildly tender to palpation with no rebound.  Bowel sounds positive.). There is no right CVA tenderness, left CVA tenderness, guarding or rebound. Negative signs include Murphy's sign and Rovsing's sign.  Musculoskeletal:     Cervical back: Normal range of motion. No swelling, deformity, signs of trauma, rigidity, spasms, tenderness, bony tenderness or crepitus. No pain with movement.     Thoracic back: No swelling, deformity, signs of trauma, spasms, tenderness or bony tenderness. Normal range of motion. No scoliosis.  Lumbar back: No swelling, deformity, signs of trauma, spasms, tenderness or bony tenderness. Normal range of motion. Negative right straight leg raise test and negative left  straight leg raise test. No scoliosis.     Comments: There is no paraspinous or midline spinous tenderness, deformity, stepoff to palpation.  Bilateral lumbar paraspinous tenderness elicited with flexion and extension of lumbar spine.  Absolutely no other injury, deformity, tenderness, ecchymosis, abrasion.  Neurological:     General: No focal deficit present.     Mental Status: She is alert.     Cranial Nerves: No cranial nerve deficit.  Psychiatric:        Mood and Affect: Mood normal.        Behavior: Behavior normal.        Thought Content: Thought content normal.        Judgment: Judgment normal.      UC Treatments / Results  Labs (all labs ordered are listed, but only abnormal results are displayed) Labs Reviewed  POCT URINALYSIS DIP (MANUAL ENTRY) - Abnormal; Notable for the following components:      Result Value   Clarity, UA turbid (*)    Blood, UA large (*)    All other components within normal limits  URINE CULTURE  POCT URINE PREGNANCY    EKG   Radiology No results found.  Procedures Procedures (including critical care time)  Medications Ordered in UC Medications - No data to display  Initial Impression / Assessment and Plan / UC Course  I have reviewed the triage vital signs and the nursing notes.  Pertinent labs & imaging results that were available during my care of the patient were reviewed by me and considered in my medical decision making (see chart for details).     Lumbar strain Reassuring exam.  There is no midline spinous tenderness, deformity, step-off.  There is no radiation of the pain, and there are no red flag symptoms.  There is no CVAT.  Will manage symptomatically with over-the-counter medication for discomfort. Abdominal pain UA with negative leuk, negative nitrite, positive blood.  She is due for her menstrual period.  Urine pregnancy negative.  Her pain may be explained by menstrual cramps.  We discussed that I have low concern for  bowel obstruction, cholecystitis, acute intra-abdominal pathology; however I cannot totally exclude this in the urgent care setting.  She will contact her primary care provider for further management, but if the pain gets worse, she will present to the emergency department.  Final Clinical Impressions(s) / UC Diagnoses   Final diagnoses:  Abdominal pain, unspecified abdominal location  Strain of lumbar region, initial encounter     Discharge Instructions      -Your urine looks normal. I will send a culture to make sure.  -Contact your primary care for next steps. If symptoms worsen rapidly, or change in nature, present to the Emergency Department.     ED Prescriptions   None    PDMP not reviewed this encounter.   Arlyss Leita BRAVO, PA-C 08/07/24 [phone]

## 2024-08-07 NOTE — Discharge Instructions (Addendum)
-  Your urine looks normal. I will send a culture to make sure.  -Contact your primary care for next steps. If symptoms worsen rapidly, or change in nature, present to the Emergency Department.

## 2024-08-07 NOTE — ED Triage Notes (Signed)
 PT reports ABD pain 2 days ago . Pt reports Back pain started 3 days ago.PT reports pain is worse. Pt reports feeling nauseated but denies Vomiting or diarrhea.

## 2024-08-08 ENCOUNTER — Ambulatory Visit (HOSPITAL_COMMUNITY): Payer: Self-pay

## 2024-08-08 LAB — URINE CULTURE

## 2024-08-31 ENCOUNTER — Telehealth: Payer: Self-pay | Admitting: Internal Medicine

## 2024-08-31 NOTE — Telephone Encounter (Signed)
 Copied from CRM [id number]. Topic: Clinical - Medical Advice >> Aug 31, 2024 10:55 AM Jasmin G wrote: Reason for CRM: Pt requested a call back from one of Dr. Sanda nurses to discuss recent moderate high bp, pt states that this week her bp has been fluctuating, last bp read at 132/106. Call pt back at [phone] to discuss.

## 2024-08-31 NOTE — Telephone Encounter (Signed)
 Pt states that she has been seeing readings 130/90 and then today was 140/108. Advised her to watch her salt intake, stress, pain as this can elevate her bp.   She is scheduled for Tuesday and will bring in cuff to compare

## 2024-09-04 ENCOUNTER — Ambulatory Visit (INDEPENDENT_AMBULATORY_CARE_PROVIDER_SITE_OTHER): Admitting: Medical

## 2024-09-04 ENCOUNTER — Encounter: Payer: Self-pay | Admitting: Medical

## 2024-09-04 VITALS — BP 150/84 | HR 73 | Ht 66.0 in | Wt 259.8 lb

## 2024-09-04 DIAGNOSIS — R3129 Other microscopic hematuria: Secondary | ICD-10-CM

## 2024-09-04 DIAGNOSIS — K219 Gastro-esophageal reflux disease without esophagitis: Secondary | ICD-10-CM

## 2024-09-04 DIAGNOSIS — R634 Abnormal weight loss: Secondary | ICD-10-CM | POA: Diagnosis not present

## 2024-09-04 DIAGNOSIS — R5381 Other malaise: Secondary | ICD-10-CM

## 2024-09-04 DIAGNOSIS — E049 Nontoxic goiter, unspecified: Secondary | ICD-10-CM

## 2024-09-04 DIAGNOSIS — R030 Elevated blood-pressure reading, without diagnosis of hypertension: Secondary | ICD-10-CM | POA: Diagnosis not present

## 2024-09-04 DIAGNOSIS — L4050 Arthropathic psoriasis, unspecified: Secondary | ICD-10-CM

## 2024-09-04 LAB — POCT URINE PREGNANCY: Preg Test, Ur: NEGATIVE

## 2024-09-04 LAB — POCT URINALYSIS DIP (PROADVANTAGE DEVICE)
Bilirubin, UA: NEGATIVE
Glucose, UA: NEGATIVE mg/dL
Leukocytes, UA: NEGATIVE
Nitrite, UA: NEGATIVE
Protein Ur, POC: NEGATIVE mg/dL
Specific Gravity, Urine: 1.025
Urobilinogen, Ur: NEGATIVE
pH, UA: 6 (ref 5.0–8.0)

## 2024-09-04 NOTE — Progress Notes (Unsigned)
 Subjective:  Veronica Contreras is a 48 y.o. female who presents for Chief Complaint  Patient presents with   other    High B/P- feeling sick-weight loss-not feeling hungry for 2 weeks     History of Present Illness Veronica Contreras is a 48 year old female who presents with unintentional weight loss and decreased appetite.  Over the past two weeks, she has experienced unintentional weight loss, dropping from 264 pounds to 259 pounds. She reports a significant decrease in appetite and often feels nauseated when eating. She lacks desire for food, even when she needs to eat to take her arthritis medication.  She feels generally unwell and has noticed elevated blood pressure during a recent visit to her rheumatologist, despite no history of hypertension. She describes a sensation of pressure in her head and blurred vision when removing her glasses. Episodes of dizziness occur, particularly when sitting for extended periods.  She experiences fluctuations in body temperature, alternating between feeling hot and cold, and has increased urination frequency. No fever, heartburn, indigestion, constipation, diarrhea, blood in stool, burning during urination, or blood in urine. She reports lower back pain, occasional palpitations, a metallic taste in her mouth, and a slight headache.  Her last menstrual period was on August 05, 2024, and she is sexually active. She does not believe she is pregnant, citing a negative pregnancy test from last month.   Same partner for 15 years.   She had some recent labs done by rheumatology  No other aggravating or relieving factors.    No other c/o.  Past Medical History:  Diagnosis Date   Acid reflux    B12 deficiency 10/2020   Ectopic pregnancy    Foot fracture, right    prior   Goiter    Psoriasis    nails   Vitamin D  deficiency    Current Outpatient Medications on File Prior to Visit  Medication Sig Dispense Refill   CELEBREX 200 MG capsule Take 200 mg  by mouth 2 (two) times daily.     cyanocobalamin  (VITAMIN B12) 1000 MCG tablet Take 1 tablet (1,000 mcg total) by mouth daily. 90 tablet 2   Risankizumab-rzaa (SKYRIZI, 150 MG DOSE, [place]) Inject 150 mg into the skin every 3 (three) months.     rosuvastatin  (CRESTOR ) 10 MG tablet TAKE 1 TABLET BY MOUTH EVERY OTHER DAY 45 tablet 1   Vitamin D , Ergocalciferol , (DRISDOL ) 1.25 MG (50000 UNIT) CAPS capsule Take 1 capsule (50,000 Units total) by mouth every 7 (seven) days. 12 capsule 3   No current facility-administered medications on file prior to visit.   Past Surgical History:  Procedure Laterality Date   COLONOSCOPY  2015   Dr. Renaye Sous   COLONOSCOPY  06/2022   diverticula, othewrise normal, repeat 10 years, Dr. Sous   LAPAROSCOPY FOR ECTOPIC PREGNANCY     Salpingectomy   SALPINGECTOMY     R/T ectopic pregnancy     The following portions of the patient's history were reviewed and updated as appropriate: allergies, current medications, past family history, past medical history, past social history, past surgical history and problem list.  Review of Systems  Constitutional:  Positive for chills, malaise/fatigue and weight loss.  HENT:  Negative for congestion, ear pain, sinus pain and sore throat.   Eyes:  Positive for blurred vision.  Respiratory:  Negative for cough and shortness of breath.   Cardiovascular:  Positive for palpitations. Negative for chest pain and leg swelling.  Gastrointestinal:  Positive for  nausea. Negative for abdominal pain, blood in stool, constipation, diarrhea, heartburn and vomiting.  Genitourinary:  Positive for frequency. Negative for dysuria, flank pain, hematuria and urgency.  Musculoskeletal:  Positive for back pain. Negative for myalgias.  Neurological:  Positive for dizziness and headaches. Negative for weakness.  Endo/Heme/Allergies:  Negative for polydipsia.     Objective: BP (!) 150/84   Pulse 73   Ht 5' 6 (1.676 m)   Wt 259 lb 12.8 oz (117.8  kg)   LMP 08/05/2024 (Approximate) Comment: PT reports irregular periods  SpO2 98%   BMI 41.93 kg/m   BP Readings from Last 3 Encounters:  09/04/24 (!) 150/84  08/07/24 112/76  07/03/24 104/72   Wt Readings from Last 3 Encounters:  09/04/24 259 lb 12.8 oz (117.8 kg)  06/21/24 274 lb 6.4 oz (124.5 kg)  04/20/24 264 lb (119.7 kg)    General appearance: alert, no distress, well developed, well nourished HEENT: normocephalic, sclerae anicteric, conjunctiva pink and moist, TMs pearly, nares patent, no discharge or erythema, pharynx normal Oral cavity: MMM, no lesions Neck: supple, no lymphadenopathy, moderate sized goiter, no masses, no JVD or bruits Heart: RRR, normal S1, S2, no murmurs Lungs: CTA bilaterally, no wheezes, rhonchi, or rales Abdomen: +bs, soft, non tender, non distended, no masses, no hepatomegaly, no splenomegaly Pulses: 2+ radial pulses, 2+ pedal pulses, normal cap refill Ext: no edema    Assessment: Encounter Diagnoses  Name Primary?   Malaise Yes   Weight loss, unintentional    Elevated blood pressure reading    Goiter    Gastroesophageal reflux disease, unspecified whether esophagitis present    Psoriatic arthritis (HCC)      Plan: We discussed a wide differential which could include diabetes, thyroiditis, infection, tumor, or other.  Labs as below.  I reviewed lab results on her fall from the portal from [facility] rheumatology  Comprehensive metabolic panel on August 29, 2024: Albumin slightly low at 3.8, otherwise was liver kidney electrolytes normal  Mammogram normal 07/05/2024  She is up-to-date on Pap smear and colonoscopy  She does note some recent dental work that is still giving her some issue  We discussed if labs are unremarkable we will likely pursue CT chest abdomen pelvis   Veronica Contreras was seen today for other.  Diagnoses and all orders for this visit:  Malaise  Weight loss, unintentional  Elevated blood pressure  reading  Goiter  Gastroesophageal reflux disease, unspecified whether esophagitis present  Psoriatic arthritis (HCC)   Follow up: pending labs

## 2024-09-05 ENCOUNTER — Ambulatory Visit: Payer: Self-pay | Admitting: Medical

## 2024-09-05 LAB — CBC WITH DIFFERENTIAL/PLATELET
Basophils Absolute: 0 x10E3/uL (ref 0.0–0.2)
Basos: 1 %
EOS (ABSOLUTE): 0.2 x10E3/uL (ref 0.0–0.4)
Eos: 3 %
Hematocrit: 42.9 % (ref 34.0–46.6)
Hemoglobin: 13.9 g/dL (ref 11.1–15.9)
Immature Grans (Abs): 0 x10E3/uL (ref 0.0–0.1)
Immature Granulocytes: 0 %
Lymphocytes Absolute: 2.3 x10E3/uL (ref 0.7–3.1)
Lymphs: 37 %
MCH: 31 pg (ref 26.6–33.0)
MCHC: 32.4 g/dL (ref 31.5–35.7)
MCV: 96 fL (ref 79–97)
Monocytes Absolute: 0.7 x10E3/uL (ref 0.1–0.9)
Monocytes: 11 %
Neutrophils Absolute: 3 x10E3/uL (ref 1.4–7.0)
Neutrophils: 48 %
Platelets: 297 x10E3/uL (ref 150–450)
RBC: 4.49 x10E6/uL (ref 3.77–5.28)
RDW: 14.2 % (ref 11.7–15.4)
WBC: 6.2 x10E3/uL (ref 3.4–10.8)

## 2024-09-05 LAB — TSH+FREE T4
Free T4: 1.14 ng/dL (ref 0.82–1.77)
TSH: 1.32 u[IU]/mL (ref 0.450–4.500)

## 2024-09-05 LAB — GLUCOSE, RANDOM: Glucose: 92 mg/dL (ref 70–99)

## 2024-09-05 LAB — LIPASE: Lipase: 24 U/L (ref 14–72)

## 2024-09-05 NOTE — Progress Notes (Signed)
 Results through MyChart

## 2024-09-05 NOTE — Addendum Note (Signed)
 Addended by: BULAH ALM RAMAN on: 09/05/2024 01:36 PM   Modules accepted: Orders

## 2024-09-07 ENCOUNTER — Encounter: Payer: Self-pay | Admitting: Medical

## 2024-09-13 ENCOUNTER — Ambulatory Visit (INDEPENDENT_AMBULATORY_CARE_PROVIDER_SITE_OTHER): Payer: Self-pay

## 2024-09-13 ENCOUNTER — Encounter (INDEPENDENT_AMBULATORY_CARE_PROVIDER_SITE_OTHER): Payer: Self-pay

## 2024-09-13 VITALS — BP 137/84 | HR 58

## 2024-09-13 DIAGNOSIS — E042 Nontoxic multinodular goiter: Secondary | ICD-10-CM | POA: Diagnosis not present

## 2024-09-13 NOTE — Progress Notes (Signed)
 Dear Dr. Bulah, Here is my assessment for our mutual patient, Veronica Contreras. Thank you for allowing me the opportunity to care for your patient. Please do not hesitate to contact me should you have any other questions. Sincerely, Dr. Hadassah Parody  Otolaryngology Clinic Note Referring provider: Dr. Bulah HPI:   Initial HPI Veronica Contreras 07/03/24) Veronica Contreras is a 48 year old female with multiple thyroid  nodules who presents with a sensation of a lump in her throat, pressure on her neck and noticeable anterior neck enlargement in the area of thyroid  gland.    She has a history of multiple thyroid  nodules that were biopsied in 2023, with results indicating they were benign. No recent ultrasounds have been performed since the biopsy.   She experiences a sensation of a lump in her throat, described as 'a feeling of something being stuck'. This sensation does not interfere with swallowing. She has observed that her thyroid  is more visible now than before and she has some neck pressure.    Normal TFTs before.   --------------------------------------------------------- 09/13/2024 Pt presents for pre-op follow-up. Prior pt of Dr. Soldatova.   No major change in sx.    Independent Review of Additional Tests or Records:  Notes by Elena Larry 07/03/24 reviewed: large goiter with compressive sx. Plan for total thyroidectomy. Scope exam with normal VF mobility.    PMH/Meds/All/SocHx/FamHx/ROS:   Past Medical History:  Diagnosis Date   Acid reflux    B12 deficiency 10/2020   Ectopic pregnancy    Foot fracture, right    prior   Goiter    Psoriasis    nails   Vitamin D  deficiency      Past Surgical History:  Procedure Laterality Date   COLONOSCOPY  2015   Dr. Renaye Sous   COLONOSCOPY  06/2022   diverticula, othewrise normal, repeat 10 years, Dr. Sous   LAPAROSCOPY FOR ECTOPIC PREGNANCY     Salpingectomy   SALPINGECTOMY     R/T ectopic pregnancy    Family History   Problem Relation Age of Onset   Hypertension Mother    Diabetes Mother        complication of medication   Arthritis Brother    Breast cancer Maternal Aunt    Thyroid  disease Maternal Aunt    Heart disease Maternal Uncle        died of MI   Heart disease Maternal Uncle        cabg   Stroke Maternal Grandmother    Aneurysm Paternal Grandmother    Dementia Paternal Grandmother    Heart disease Paternal Grandfather        CABG   Thyroid  disease Cousin      Social Connections: Socially Isolated (04/20/2023)   Social Connection and Isolation Panel    Frequency of Communication with Friends and Family: Once a week    Frequency of Social Gatherings with Friends and Family: Never    Attends Religious Services: Never    Database Administrator or Organizations: No    Attends Engineer, Structural: Never    Marital Status: Living with partner     Current Outpatient Medications  Medication Instructions   CeleBREX 200 mg, 2 times daily   cyanocobalamin  (VITAMIN B12) 1,000 mcg, Oral, Daily   Risankizumab-rzaa (SKYRIZI, 150 MG DOSE, [place]) 150 mg, Every 3 months   rosuvastatin  (CRESTOR ) 10 mg, Oral, Every other day   Vitamin D  (Ergocalciferol ) (DRISDOL ) 50,000 Units, Oral, Every 7 days  Physical Exam:   BP 137/84   Pulse (!) 58   LMP 08/05/2024 (Approximate) Comment: PT reports irregular periods  Salient findings:  CN II-XII intact No lesions of oral cavity/oropharynx Large goiter does not feel like it extends substernally  No respiratory distress or stridor  Seprately Identifiable Procedures:  Prior to initiating any procedures, risks/benefits/alternatives were explained to the patient and verbal consent obtained. None  Impression & Plans:  Veronica Contreras is a 48 y.o. female with   1. Multinodular goiter    Enlarged thyroid  gland (goiter) with compressive symptoms  - planned total thyroidectomy She has an enlarged thyroid  causing discomfort and pressure. Total  thyroidectomy planned to relieve symptoms and prevent enlargement. - Monitor vocal cord nerve function intraoperatively to prevent nerve injury. - Monitor calcium  levels postoperatively; provide supplementation if necessary to prevent hypocalcemia. - Place neck drain postoperatively; remove a few days later. - Prescribe lifelong thyroid  hormone replacement therapy postoperatively. - Advise no heavy lifting for two weeks post-surgery.  We had a thorough discussion regarding thyroidectomy, risks and post-op expectations. Risks of thyroidectomy were discussed including pain, bleeding, infection, need for drain placement, injury to recurrent laryngeal nerve including vocal fold paralysis and hoarseness, lack of improvement, need for further procedures, change in swallowing, hypocalcemia, injury to trachea or surrounding great vessels including pulmonary complications, and discovery of malignancy. Patient understands these risks, and despite these risks, wishes to proceed.   See below regarding exact medications prescribed this encounter including dosages and route: No orders of the defined types were placed in this encounter.   I personally spent a total of 30 minutes in the care of the patient today including preparing to see the patient, getting/reviewing separately obtained history, and counseling and educating.  Thank you for allowing me the opportunity to care for your patient. Please do not hesitate to contact me should you have any other questions.  Sincerely, Hadassah Parody, MD Otolaryngologist (ENT), [facility] Phone: [phone] Fax: [phone]

## 2024-09-14 ENCOUNTER — Ambulatory Visit
Admission: RE | Admit: 2024-09-14 | Discharge: 2024-09-14 | Disposition: A | Discharge status: Home / Self Care | Source: Ambulatory Visit | Attending: Medical | Admitting: Medical

## 2024-09-14 DIAGNOSIS — R3129 Other microscopic hematuria: Secondary | ICD-10-CM

## 2024-09-14 DIAGNOSIS — R5381 Other malaise: Secondary | ICD-10-CM

## 2024-09-14 DIAGNOSIS — K219 Gastro-esophageal reflux disease without esophagitis: Secondary | ICD-10-CM

## 2024-09-14 DIAGNOSIS — R634 Abnormal weight loss: Secondary | ICD-10-CM

## 2024-09-14 MED ORDER — IOPAMIDOL (ISOVUE-300) INJECTION 61%
100.0000 mL | Freq: Once | INTRAVENOUS | Status: AC | PRN
  Administered 2024-09-14: 100 mL via INTRAVENOUS

## 2024-09-17 ENCOUNTER — Other Ambulatory Visit: Payer: Self-pay | Admitting: Medical

## 2024-09-17 DIAGNOSIS — R5381 Other malaise: Secondary | ICD-10-CM

## 2024-09-17 DIAGNOSIS — K219 Gastro-esophageal reflux disease without esophagitis: Secondary | ICD-10-CM

## 2024-09-17 DIAGNOSIS — E049 Nontoxic goiter, unspecified: Secondary | ICD-10-CM

## 2024-09-17 DIAGNOSIS — R3129 Other microscopic hematuria: Secondary | ICD-10-CM

## 2024-09-17 DIAGNOSIS — R634 Abnormal weight loss: Secondary | ICD-10-CM

## 2024-09-17 DIAGNOSIS — L4050 Arthropathic psoriasis, unspecified: Secondary | ICD-10-CM

## 2024-09-17 DIAGNOSIS — R030 Elevated blood-pressure reading, without diagnosis of hypertension: Secondary | ICD-10-CM

## 2024-09-18 ENCOUNTER — Ambulatory Visit
Admission: RE | Admit: 2024-09-18 | Discharge: 2024-09-18 | Disposition: A | Source: Ambulatory Visit | Attending: Medical | Admitting: Medical

## 2024-09-18 ENCOUNTER — Ambulatory Visit: Payer: Self-pay | Admitting: Medical

## 2024-09-18 DIAGNOSIS — R3129 Other microscopic hematuria: Secondary | ICD-10-CM

## 2024-09-18 DIAGNOSIS — R634 Abnormal weight loss: Secondary | ICD-10-CM

## 2024-09-18 DIAGNOSIS — K219 Gastro-esophageal reflux disease without esophagitis: Secondary | ICD-10-CM

## 2024-09-18 DIAGNOSIS — R5381 Other malaise: Secondary | ICD-10-CM

## 2024-09-18 MED ORDER — IOPAMIDOL (ISOVUE-300) INJECTION 61%
75.0000 mL | Freq: Once | INTRAVENOUS | Status: AC | PRN
  Administered 2024-09-18: 75 mL via INTRAVENOUS

## 2024-09-18 NOTE — Progress Notes (Signed)
 Scan shows questionable findings of the uterus and ovary area.  They recommend possible MRI.  What are your current symptoms, any worse, better?   I would recommend follow up with gynecology and examination given these findings.

## 2024-09-19 ENCOUNTER — Ambulatory Visit: Payer: Self-pay

## 2024-09-19 NOTE — Telephone Encounter (Signed)
 FYI Only or Action Required?: Action required by provider: lab or test result follow-up needed.  Patient was last seen in primary care on 09/04/2024 by Bulah Alm RAMAN, PA-C.  Called Nurse Triage reporting Results. Triage Disposition: Call PCP When Office is Open  Patient/caregiver understands and will follow disposition?: Yes        Copied from CRM [id number]. Topic: Clinical - Lab/Test Results >> Sep 19, 2024  2:52 PM Veronica Contreras wrote: Reason for CRM: Pt is concered on the test results for CT scan. Would like to speak with someone regarding results. Please advise #[phone]        Reason for Disposition  Caller requesting routine or non-urgent lab result  Answer Assessment - Initial Assessment Questions Patient calling for clarification of her CT chest results, stating she had some concerns. She would like someone to reach out to her when able. Please advise.     1. REASON FOR CALL or QUESTION: What is your reason for calling today? or How can I best     CT results 2. CALLER: Document the source of call. (e.g., laboratory staff, caregiver or patient).     Patient  Protocols used: PCP Call - No Triage-A-AH

## 2024-09-20 NOTE — Telephone Encounter (Signed)
 Sent ov notes and CT results to obgyn

## 2024-09-27 NOTE — Pre-Procedure Instructions (Signed)
 Surgical Instructions   Your procedure is scheduled on October 03, 2024. Report to [facility] Main Entrance A at 9:15 A.M., then check in with the Admitting office. Any questions or running late day of surgery: call [phone]  Questions prior to your surgery date: call [phone], Monday-Friday, 8am-4pm. If you experience any cold or flu symptoms such as cough, fever, chills, shortness of breath, etc. between now and your scheduled surgery, please notify us  at the above number.     Remember:  Do not eat after midnight the night before your surgery   You may drink clear liquids until 8:15 AM the morning of your surgery.   Clear liquids allowed are: Water, Non-Citrus Juices (without pulp), Carbonated Beverages, Clear Tea (no milk, honey, etc.), Black Coffee Only (NO MILK, CREAM OR POWDERED CREAMER of any kind), and Gatorade.    Take these medicines the morning of surgery with A SIP OF WATER: rosuvastatin  (CRESTOR )    May take these medicines IF NEEDED: LANSOPRAZOLE PO    One week prior to surgery, STOP taking any Aspirin (unless otherwise instructed by your surgeon) Aleve, Naproxen, Ibuprofen , Motrin , Advil , Goody's, BC's, all herbal medications, fish oil, and non-prescription vitamins. This includes your medication: CELEBREX                      Do NOT Smoke (Tobacco/Vaping) for 24 hours prior to your procedure.  If you use a CPAP at night, you may bring your mask/headgear for your overnight stay.   You will be asked to remove any contacts, glasses, piercing's, hearing aid's, dentures/partials prior to surgery. Please bring cases for these items if needed.    Patients discharged the day of surgery will not be allowed to drive home, and someone needs to stay with them for 24 hours.  SURGICAL WAITING ROOM VISITATION Patients may have no more than 2 support people in the waiting area - these visitors may rotate.   Pre-op nurse will coordinate an appropriate time for 1  ADULT support person, who may not rotate, to accompany patient in pre-op.  Children under the age of 35 must have an adult with them who is not the patient and must remain in the main waiting area with an adult.  If the patient needs to stay at the hospital during part of their recovery, the visitor guidelines for inpatient rooms apply.  Please refer to the [facility] website for the visitor guidelines for any additional information.   If you received a COVID test during your pre-op visit  it is requested that you wear a mask when out in public, stay away from anyone that may not be feeling well and notify your surgeon if you develop symptoms. If you have been in contact with anyone that has tested positive in the last 10 days please notify you surgeon.      Pre-operative CHG Bathing Instructions   You can play a key role in reducing the risk of infection after surgery. Your skin needs to be as free of germs as possible. You can reduce the number of germs on your skin by washing with CHG (chlorhexidine gluconate) soap before surgery. CHG is an antiseptic soap that kills germs and continues to kill germs even after washing.   DO NOT use if you have an allergy to chlorhexidine/CHG or antibacterial soaps. If your skin becomes reddened or irritated, stop using the CHG and notify one of our RNs at [phone].  TAKE A SHOWER THE NIGHT BEFORE SURGERY   Please keep in mind the following:  DO NOT shave, including legs and underarms, 48 hours prior to surgery.   You may shave your face before/day of surgery.  Place clean sheets on your bed the night before surgery Use a clean washcloth (not used since being washed) for shower. DO NOT sleep with pet's night before surgery.  CHG Shower Instructions:  Wash your face and private area with normal soap. If you choose to wash your hair, wash first with your normal shampoo.  After you use shampoo/soap, rinse your hair and body thoroughly  to remove shampoo/soap residue.  Turn the water OFF and apply half the bottle of CHG soap to a CLEAN washcloth.  Apply CHG soap ONLY FROM YOUR NECK DOWN TO YOUR TOES (washing for 3-5 minutes)  DO NOT use CHG soap on face, private areas, open wounds, or sores.  Pay special attention to the area where your surgery is being performed.  If you are having back surgery, having someone wash your back for you may be helpful. Wait 2 minutes after CHG soap is applied, then you may rinse off the CHG soap.  Pat dry with a clean towel  Put on clean pajamas    Additional instructions for the day of surgery: If you choose, you may shower the morning of surgery with an antibacterial soap.  DO NOT APPLY any lotions, deodorants, cologne, or perfumes.   Do not wear jewelry or makeup Do not wear nail polish, gel polish, artificial nails, or any other type of covering on natural nails (fingers and toes) Do not bring valuables to the hospital. [facility] is not responsible for valuables/personal belongings. Put on clean/comfortable clothes.  Please brush your teeth.  Ask your nurse before applying any prescription medications to the skin.

## 2024-09-28 ENCOUNTER — Encounter (HOSPITAL_COMMUNITY): Payer: Self-pay

## 2024-09-28 ENCOUNTER — Other Ambulatory Visit: Payer: Self-pay

## 2024-09-28 ENCOUNTER — Encounter (HOSPITAL_COMMUNITY)
Admission: RE | Admit: 2024-09-28 | Discharge: 2024-09-28 | Disposition: A | Discharge status: Home / Self Care | Source: Ambulatory Visit

## 2024-09-28 VITALS — BP 147/82 | HR 85 | Temp 97.9°F | Resp 18 | Ht 66.0 in | Wt 255.7 lb

## 2024-09-28 DIAGNOSIS — Z01818 Encounter for other preprocedural examination: Secondary | ICD-10-CM | POA: Insufficient documentation

## 2024-09-28 HISTORY — DX: Thyrotoxicosis, unspecified without thyrotoxic crisis or storm: E05.90

## 2024-09-28 NOTE — Progress Notes (Signed)
 PCP - Alm Gent, PA-C Cardiologist - Denies  PPM/ICD - Denies Device Orders - n/a Rep Notified - n/a  Chest x-ray - n/a EKG - Denies Stress Test - Denies ECHO - Denies Cardiac Cath - Denies  Sleep Study - Denies CPAP - n/a  No DM  Last dose of GLP1 agonist- n/a GLP1 instructions: n/a  Blood Thinner Instructions: n/a Aspirin Instructions: n/a  ERAS Protcol - Clear liquids until 0815 morning of surgery PRE-SURGERY Ensure or G2- n/a  COVID TEST- n/a   Anesthesia review: No   Patient denies shortness of breath, fever, cough and chest pain at PAT appointment. Pt denies any respiratory illness/infection in the last two months.   All instructions explained to the patient, with a verbal understanding of the material. Patient agrees to go over the instructions while at home for a better understanding. Patient also instructed to self quarantine after being tested for COVID-19. The opportunity to ask questions was provided.

## 2024-10-03 ENCOUNTER — Ambulatory Visit (HOSPITAL_COMMUNITY)

## 2024-10-03 ENCOUNTER — Encounter (HOSPITAL_COMMUNITY): Admission: RE | Disposition: A | Payer: Self-pay | Source: Home / Self Care

## 2024-10-03 ENCOUNTER — Observation Stay (HOSPITAL_COMMUNITY): Admission: RE | Admit: 2024-10-03 | Discharge: 2024-10-05 | Disposition: A | Discharge status: Home / Self Care

## 2024-10-03 ENCOUNTER — Other Ambulatory Visit: Payer: Self-pay

## 2024-10-03 ENCOUNTER — Encounter (HOSPITAL_COMMUNITY): Payer: Self-pay

## 2024-10-03 DIAGNOSIS — E785 Hyperlipidemia, unspecified: Secondary | ICD-10-CM

## 2024-10-03 DIAGNOSIS — Z6841 Body Mass Index (BMI) 40.0 and over, adult: Secondary | ICD-10-CM | POA: Diagnosis not present

## 2024-10-03 DIAGNOSIS — E042 Nontoxic multinodular goiter: Secondary | ICD-10-CM

## 2024-10-03 DIAGNOSIS — E66813 Obesity, class 3: Secondary | ICD-10-CM

## 2024-10-03 DIAGNOSIS — F17210 Nicotine dependence, cigarettes, uncomplicated: Secondary | ICD-10-CM | POA: Insufficient documentation

## 2024-10-03 DIAGNOSIS — E049 Nontoxic goiter, unspecified: Principal | ICD-10-CM | POA: Diagnosis present

## 2024-10-03 DIAGNOSIS — Z01818 Encounter for other preprocedural examination: Secondary | ICD-10-CM

## 2024-10-03 HISTORY — PX: TOTAL THYROIDECTOMY: SHX2547

## 2024-10-03 HISTORY — PX: THYROIDECTOMY: SHX17

## 2024-10-03 LAB — CBC
HCT: 41.5 % (ref 36.0–46.0)
Hemoglobin: 14.1 g/dL (ref 12.0–15.0)
MCH: 31.8 pg (ref 26.0–34.0)
MCHC: 34 g/dL (ref 30.0–36.0)
MCV: 93.5 fL (ref 80.0–100.0)
Platelets: 289 K/uL (ref 150–400)
RBC: 4.44 MIL/uL (ref 3.87–5.11)
RDW: 13.7 % (ref 11.5–15.5)
WBC: 11.2 K/uL — ABNORMAL HIGH (ref 4.0–10.5)
nRBC: 0 % (ref 0.0–0.2)

## 2024-10-03 LAB — COMPREHENSIVE METABOLIC PANEL WITH GFR
ALT: 19 U/L (ref 0–44)
AST: 27 U/L (ref 15–41)
Albumin: 3.8 g/dL (ref 3.5–5.0)
Alkaline Phosphatase: 68 U/L (ref 38–126)
Anion gap: 14 (ref 5–15)
BUN: 9 mg/dL (ref 6–20)
CO2: 19 mmol/L — ABNORMAL LOW (ref 22–32)
Calcium: 8.5 mg/dL — ABNORMAL LOW (ref 8.9–10.3)
Chloride: 103 mmol/L (ref 98–111)
Creatinine, Ser: 0.79 mg/dL (ref 0.44–1.00)
GFR, Estimated: >60 mL/min (ref >60)
Glucose, Bld: 131 mg/dL — ABNORMAL HIGH (ref 70–99)
Potassium: 4.3 mmol/L (ref 3.5–5.1)
Sodium: 136 mmol/L (ref 135–145)
Total Bilirubin: 0.8 mg/dL (ref 0.0–1.2)
Total Protein: 7.6 g/dL (ref 6.5–8.1)

## 2024-10-03 LAB — CREATININE, SERUM
Creatinine, Ser: 0.76 mg/dL (ref 0.44–1.00)
GFR, Estimated: >60 mL/min (ref >60)

## 2024-10-03 LAB — POCT PREGNANCY, URINE: Preg Test, Ur: NEGATIVE

## 2024-10-03 SURGERY — THYROIDECTOMY
Anesthesia: General | Site: Throat | Laterality: Bilateral

## 2024-10-03 MED ORDER — AMISULPRIDE (ANTIEMETIC) 5 MG/2ML IV SOLN: 10.0000 mg | Freq: Once | INTRAVENOUS | Status: DC | PRN

## 2024-10-03 MED ORDER — OXYCODONE HCL 5 MG PO TABS: 5.0000 mg | ORAL_TABLET | Freq: Once | ORAL | Status: DC | PRN

## 2024-10-03 MED ORDER — HEMOSTATIC AGENTS (NO CHARGE) OPTIME
TOPICAL | Status: DC | PRN
  Administered 2024-10-03: 1 via TOPICAL

## 2024-10-03 MED ORDER — CALCIUM CARBONATE ANTACID 500 MG PO CHEW
400.0000 mg | CHEWABLE_TABLET | Freq: Three times a day (TID) | ORAL | Status: DC
  Administered 2024-10-03 – 2024-10-04 (×2): 400 mg via ORAL
  Filled 2024-10-03 (×2): qty 2

## 2024-10-03 MED ORDER — OXYCODONE HCL 5 MG PO TABS
5.0000 mg | ORAL_TABLET | Freq: Four times a day (QID) | ORAL | Status: DC | PRN
  Administered 2024-10-03 – 2024-10-04 (×2): 5 mg via ORAL
  Filled 2024-10-03 (×2): qty 1

## 2024-10-03 MED ORDER — CALCIUM CARBONATE ANTACID 500 MG PO CHEW
400.0000 mg | CHEWABLE_TABLET | Freq: Three times a day (TID) | ORAL | Status: DC | PRN
Start: 2024-10-03 — End: 2024-10-03

## 2024-10-03 MED ORDER — HYDROMORPHONE HCL 1 MG/ML IJ SOLN
INTRAMUSCULAR | Status: AC
  Filled 2024-10-03: qty 0.5

## 2024-10-03 MED ORDER — CALCITRIOL 0.5 MCG PO CAPS
0.5000 ug | ORAL_CAPSULE | Freq: Every day | ORAL | Status: DC
  Administered 2024-10-03 – 2024-10-04 (×2): 0.5 ug via ORAL
  Filled 2024-10-03 (×2): qty 1

## 2024-10-03 MED ORDER — ONDANSETRON HCL 4 MG/2ML IJ SOLN
4.0000 mg | INTRAMUSCULAR | Status: DC | PRN
Start: 2024-10-03 — End: 2024-10-05

## 2024-10-03 MED ORDER — ACETAMINOPHEN 650 MG RE SUPP: 650.0000 mg | RECTAL | Status: DC

## 2024-10-03 MED ORDER — LACTATED RINGERS IV SOLN: INTRAVENOUS | Status: DC

## 2024-10-03 MED ORDER — SODIUM CHLORIDE 0.9 % IV SOLN
INTRAVENOUS | Status: DC | PRN
  Administered 2024-10-03: .05 ug/kg/min via INTRAVENOUS

## 2024-10-03 MED ORDER — LIDOCAINE-EPINEPHRINE 1 %-1:100000 IJ SOLN
INTRAMUSCULAR | Status: DC | PRN
Start: 2024-10-03 — End: 2024-10-03
  Administered 2024-10-03: 7 mL

## 2024-10-03 MED ORDER — 0.9 % SODIUM CHLORIDE (POUR BTL) OPTIME
TOPICAL | Status: DC | PRN
  Administered 2024-10-03: 1000 mL

## 2024-10-03 MED ORDER — CELECOXIB 200 MG PO CAPS
ORAL_CAPSULE | ORAL | Status: AC
Start: 2024-10-03 — End: 2024-10-03
  Filled 2024-10-03: qty 1

## 2024-10-03 MED ORDER — SENNOSIDES-DOCUSATE SODIUM 8.6-50 MG PO TABS
1.0000 | ORAL_TABLET | Freq: Every evening | ORAL | Status: DC | PRN
Start: 2024-10-03 — End: 2024-10-05

## 2024-10-03 MED ORDER — PROPOFOL 500 MG/50ML IV EMUL
INTRAVENOUS | Status: DC | PRN
  Administered 2024-10-03: 40 mg via INTRAVENOUS
  Administered 2024-10-03: 150 ug/kg/min via INTRAVENOUS

## 2024-10-03 MED ORDER — FENTANYL CITRATE (PF) 100 MCG/2ML IJ SOLN
INTRAMUSCULAR | Status: AC
  Filled 2024-10-03: qty 2

## 2024-10-03 MED ORDER — FENTANYL CITRATE (PF) 250 MCG/5ML IJ SOLN
INTRAMUSCULAR | Status: AC
  Filled 2024-10-03: qty 5

## 2024-10-03 MED ORDER — CELECOXIB 200 MG PO CAPS
200.0000 mg | ORAL_CAPSULE | Freq: Once | ORAL | Status: AC
  Administered 2024-10-03: 200 mg via ORAL

## 2024-10-03 MED ORDER — ENOXAPARIN SODIUM 40 MG/0.4ML IJ SOSY
40.0000 mg | PREFILLED_SYRINGE | INTRAMUSCULAR | Status: DC
  Administered 2024-10-04 – 2024-10-05 (×2): 40 mg via SUBCUTANEOUS
  Filled 2024-10-03 (×2): qty 0.4

## 2024-10-03 MED ORDER — SODIUM CHLORIDE 0.9 % IV SOLN
0.1500 ug/kg/min | INTRAVENOUS | Status: DC
  Filled 2024-10-03: qty 1000

## 2024-10-03 MED ORDER — SODIUM CHLORIDE 0.9 % IV SOLN
0.1500 ug/kg/min | INTRAVENOUS | Status: DC
  Filled 2024-10-03: qty 2000

## 2024-10-03 MED ORDER — MIDAZOLAM HCL (PF) 2 MG/2ML IJ SOLN
INTRAMUSCULAR | Status: DC | PRN
  Administered 2024-10-03: 2 mg via INTRAVENOUS

## 2024-10-03 MED ORDER — CEFAZOLIN SODIUM-DEXTROSE 2-3 GM-%(50ML) IV SOLR
INTRAVENOUS | Status: DC | PRN
  Administered 2024-10-03: 2 g via INTRAVENOUS

## 2024-10-03 MED ORDER — PROPOFOL 10 MG/ML IV BOLUS
INTRAVENOUS | Status: DC | PRN
  Administered 2024-10-03: 200 mg via INTRAVENOUS
  Administered 2024-10-03 (×2): 50 mg via INTRAVENOUS

## 2024-10-03 MED ORDER — MIDAZOLAM HCL 2 MG/2ML IJ SOLN
INTRAMUSCULAR | Status: AC
  Filled 2024-10-03: qty 2

## 2024-10-03 MED ORDER — CELECOXIB 200 MG PO CAPS
200.0000 mg | ORAL_CAPSULE | Freq: Two times a day (BID) | ORAL | Status: DC
  Administered 2024-10-03 – 2024-10-05 (×4): 200 mg via ORAL
  Filled 2024-10-03 (×4): qty 1

## 2024-10-03 MED ORDER — LIDOCAINE 2% (20 MG/ML) 5 ML SYRINGE
INTRAMUSCULAR | Status: DC | PRN
  Administered 2024-10-03: 100 mg via INTRAVENOUS

## 2024-10-03 MED ORDER — ACETAMINOPHEN 500 MG PO TABS
1000.0000 mg | ORAL_TABLET | Freq: Once | ORAL | Status: AC
  Administered 2024-10-03: 1000 mg via ORAL

## 2024-10-03 MED ORDER — BACITRACIN ZINC 500 UNIT/GM EX OINT
1.0000 | TOPICAL_OINTMENT | Freq: Three times a day (TID) | CUTANEOUS | Status: DC
Start: 2024-10-03 — End: 2024-10-03
  Administered 2024-10-03: 1 via TOPICAL
  Filled 2024-10-03: qty 28.4

## 2024-10-03 MED ORDER — FENTANYL CITRATE (PF) 250 MCG/5ML IJ SOLN
INTRAMUSCULAR | Status: DC | PRN
  Administered 2024-10-03: 50 ug via INTRAVENOUS
  Administered 2024-10-03 (×2): 100 ug via INTRAVENOUS

## 2024-10-03 MED ORDER — ONDANSETRON HCL 4 MG PO TABS: 4.0000 mg | ORAL_TABLET | ORAL | Status: DC | PRN

## 2024-10-03 MED ORDER — FENTANYL CITRATE (PF) 100 MCG/2ML IJ SOLN
25.0000 ug | INTRAMUSCULAR | Status: DC | PRN
  Administered 2024-10-03 (×2): 50 ug via INTRAVENOUS

## 2024-10-03 MED ORDER — CHLORHEXIDINE GLUCONATE 0.12 % MT SOLN: 15.0000 mL | Freq: Once | OROMUCOSAL | Status: AC

## 2024-10-03 MED ORDER — ACETAMINOPHEN 500 MG PO TABS
ORAL_TABLET | ORAL | Status: AC
Start: 2024-10-03 — End: 2024-10-03
  Filled 2024-10-03: qty 2

## 2024-10-03 MED ORDER — SUCCINYLCHOLINE CHLORIDE 200 MG/10ML IV SOSY
PREFILLED_SYRINGE | INTRAVENOUS | Status: DC | PRN
  Administered 2024-10-03: 200 mg via INTRAVENOUS

## 2024-10-03 MED ORDER — CHLORHEXIDINE GLUCONATE 0.12 % MT SOLN
OROMUCOSAL | Status: AC
  Administered 2024-10-03: 15 mL via OROMUCOSAL
  Filled 2024-10-03: qty 15

## 2024-10-03 MED ORDER — SODIUM CHLORIDE 0.9 % IV SOLN: 12.5000 mg | INTRAVENOUS | Status: DC | PRN

## 2024-10-03 MED ORDER — LEVOTHYROXINE SODIUM 100 MCG PO TABS
100.0000 ug | ORAL_TABLET | Freq: Every day | ORAL | Status: DC
  Administered 2024-10-04 – 2024-10-05 (×2): 100 ug via ORAL
  Filled 2024-10-03 (×2): qty 1

## 2024-10-03 MED ORDER — ROSUVASTATIN CALCIUM 5 MG PO TABS
10.0000 mg | ORAL_TABLET | ORAL | Status: DC
  Administered 2024-10-04: 10 mg via ORAL
  Filled 2024-10-03: qty 2

## 2024-10-03 MED ORDER — DEXAMETHASONE SOD PHOSPHATE PF 10 MG/ML IJ SOLN
INTRAMUSCULAR | Status: DC | PRN
  Administered 2024-10-03: 10 mg via INTRAVENOUS

## 2024-10-03 MED ORDER — ACETAMINOPHEN 160 MG/5ML PO SOLN
650.0000 mg | ORAL | Status: DC
  Filled 2024-10-03 (×4): qty 20.3

## 2024-10-03 MED ORDER — OXYCODONE HCL 5 MG/5ML PO SOLN: 5.0000 mg | Freq: Once | ORAL | Status: DC | PRN

## 2024-10-03 MED ORDER — PROPOFOL 10 MG/ML IV BOLUS
INTRAVENOUS | Status: AC
  Filled 2024-10-03: qty 20

## 2024-10-03 MED ORDER — HYDROMORPHONE HCL 1 MG/ML IJ SOLN
INTRAMUSCULAR | Status: DC | PRN
  Administered 2024-10-03 (×2): .5 mg via INTRAVENOUS

## 2024-10-03 MED ORDER — ACETAMINOPHEN 325 MG PO TABS
650.0000 mg | ORAL_TABLET | ORAL | Status: DC
  Administered 2024-10-03 – 2024-10-05 (×8): 650 mg via ORAL
  Filled 2024-10-03 (×8): qty 2

## 2024-10-03 MED ORDER — LIDOCAINE-EPINEPHRINE 1 %-1:100000 IJ SOLN
INTRAMUSCULAR | Status: AC
  Filled 2024-10-03: qty 1

## 2024-10-03 MED ORDER — ORAL CARE MOUTH RINSE: 15.0000 mL | Freq: Once | OROMUCOSAL | Status: AC

## 2024-10-03 MED ORDER — ONDANSETRON HCL 4 MG/2ML IJ SOLN
INTRAMUSCULAR | Status: DC | PRN
  Administered 2024-10-03: 4 mg via INTRAVENOUS

## 2024-10-03 MED ORDER — PHENYLEPHRINE HCL (PRESSORS) 10 MG/ML IV SOLN
INTRAVENOUS | Status: DC | PRN
  Administered 2024-10-03 (×3): 160 ug via INTRAVENOUS
  Administered 2024-10-03: 80 ug via INTRAVENOUS

## 2024-10-03 SURGICAL SUPPLY — 52 items
BAG COUNTER SPONGE SURGICOUNT (BAG) ×2 IMPLANT
BLADE SURG 15 STRL LF DISP TIS (BLADE) IMPLANT
CANISTER SUCTION 3000ML PPV (SUCTIONS) ×2 IMPLANT
CLIP TI MEDIUM 24 (CLIP) ×2 IMPLANT
CLIP TI WIDE RED SMALL 24 (CLIP) ×2 IMPLANT
CNTNR URN SCR LID CUP LEK RST (MISCELLANEOUS) IMPLANT
CORD BIPOLAR FORCEPS 12FT (ELECTRODE) ×2 IMPLANT
COVER SURGICAL LIGHT HANDLE (MISCELLANEOUS) ×2 IMPLANT
DERMABOND ADVANCED .7 DNX12 (GAUZE/BANDAGES/DRESSINGS) ×2 IMPLANT
DRAIN 1/8 RD END PERF LFSIL ST (DRAIN) IMPLANT
DRAIN CHANNEL 19F RND (DRAIN) IMPLANT
DRAIN JP 15F RND TROCAR (DRAIN) IMPLANT
DRAPE INCISE IOBAN 66X45 STRL (DRAPES) ×2 IMPLANT
DRSG TEGADERM 4X4.75 (GAUZE/BANDAGES/DRESSINGS) IMPLANT
ELECT COATED BLADE 2.86 ST (ELECTRODE) ×2 IMPLANT
ELECTRODE 4 CHANNEL SET (MISCELLANEOUS) IMPLANT
ELECTRODE REM PT RTRN 9FT ADLT (ELECTROSURGICAL) ×2 IMPLANT
EVACUATOR SILICONE 100CC (DRAIN) IMPLANT
GAUZE 4X4 16PLY ~~LOC~~+RFID DBL (SPONGE) ×2 IMPLANT
GLOVE BIO SURGEON STRL SZ 6 (GLOVE) ×2 IMPLANT
GLOVE BIO SURGEON STRL SZ7.5 (GLOVE) IMPLANT
GOWN STRL REUS W/ TWL LRG LVL3 (GOWN DISPOSABLE) ×4 IMPLANT
HEMOSTAT SNOW SURGICEL 2X4 (HEMOSTASIS) ×2 IMPLANT
HOOK RETRACT STAY BLUNT 12 (MISCELLANEOUS) IMPLANT
KIT BASIN OR (CUSTOM PROCEDURE TRAY) ×2 IMPLANT
KIT TURNOVER KIT B (KITS) ×2 IMPLANT
LOCATOR NERVE 3 VOLT (DISPOSABLE) IMPLANT
MARKER SKIN DUAL TIP RULER LAB (MISCELLANEOUS) ×2 IMPLANT
NDL HYPO 25GX1X1/2 BEV (NEEDLE) ×2 IMPLANT
NEEDLE HYPO 25GX1X1/2 BEV (NEEDLE) ×1 IMPLANT
PAD ARMBOARD POSITIONER FOAM (MISCELLANEOUS) ×4 IMPLANT
PENCIL SMOKE EVACUATOR (MISCELLANEOUS) ×2 IMPLANT
POSITIONER HEAD DONUT 9IN (MISCELLANEOUS) ×2 IMPLANT
PROBE NERVBE PRASS .33 (MISCELLANEOUS) ×2 IMPLANT
RETRACTOR STAY HOOK 5MM (MISCELLANEOUS) IMPLANT
SET WALTER ACTIVATION W/DRAPE (SET/KITS/TRAYS/PACK) IMPLANT
SOLN 0.9% NACL POUR BTL 1000ML (IV SOLUTION) ×2 IMPLANT
SPONGE INTESTINAL PEANUT (DISPOSABLE) ×2 IMPLANT
SPONGE T-LAP 18X18 ~~LOC~~+RFID (SPONGE) IMPLANT
STAPLER SKIN PROX 35W (STAPLE) ×2 IMPLANT
SUT MNCRL AB 4-0 PS2 18 (SUTURE) IMPLANT
SUT MON AB 5-0 PS2 18 (SUTURE) IMPLANT
SUT PLAIN GUT FAST 5-0 (SUTURE) IMPLANT
SUT PROLENE 3 0 SH DA (SUTURE) IMPLANT
SUT SILK 2 0 REEL (SUTURE) ×2 IMPLANT
SUT SILK 2 0 SH (SUTURE) IMPLANT
SUT SILK 2 0 SH CR/8 (SUTURE) ×2 IMPLANT
SUT SILK 3 0 REEL (SUTURE) IMPLANT
SUT VIC AB 3-0 SH 8-18 (SUTURE) ×2 IMPLANT
SUT VIC AB 4-0 RB1 18 (SUTURE) ×2 IMPLANT
TOWEL GREEN STERILE FF (TOWEL DISPOSABLE) ×2 IMPLANT
TRAY ENT MC OR (CUSTOM PROCEDURE TRAY) ×2 IMPLANT

## 2024-10-03 NOTE — Transfer of Care (Signed)
 Immediate Anesthesia Transfer of Care Note  Patient: Veronica Contreras  Procedure(s) Performed: THYROIDECTOMY, TOTAL (Bilateral: Throat)  Patient Location: PACU  Anesthesia Type:General  Level of Consciousness: drowsy  Airway & Oxygen Therapy: Patient connected to face mask oxygen  Post-op Assessment: Report given to RN  Post vital signs: stable  Last Vitals:  Vitals Value Taken Time  BP 142/73 10/03/24 15:40  Temp    Pulse 96 10/03/24 15:43  Resp 15 10/03/24 15:43  SpO2 97 % 10/03/24 15:43  Vitals shown include unfiled device data.  Last Pain:  Vitals:   10/03/24 0923  TempSrc:   PainSc: 0-No pain         Complications: No notable events documented.

## 2024-10-03 NOTE — H&P (Signed)
 Pre-Operative H&P - Day Of Surgery Patient Name: Veronica Contreras Date:   10/03/2024  HPI: Veronica Contreras is a 48 y.o. female who presents today for operative treatment of goiter. Patient denies recent significant changes to health or significant new medications or physiologic change in condition which would immediately impact plans. No new types of therapy has been initiated that would change the plan or the appropriateness of the plan.   ROS:  A complete review of systems was obtained and is otherwise negative.   PMH:  Past Medical History:  Diagnosis Date   Acid reflux    B12 deficiency 10/2020   Ectopic pregnancy    Foot fracture, right    prior   Goiter    Hyperthyroidism    Psoriasis    nails   Psoriatic arthritis (HCC) 2020   Vitamin D  deficiency     PSH:  Past Surgical History:  Procedure Laterality Date   COLONOSCOPY  2015   Dr. Renaye Sous   COLONOSCOPY  06/2022   diverticula, othewrise normal, repeat 10 years, Dr. Sous   LAPAROSCOPY FOR ECTOPIC PREGNANCY     Salpingectomy   SALPINGECTOMY     R/T ectopic pregnancy    MEDS:   Current Facility-Administered Medications:    acetaminophen (TYLENOL) 500 MG tablet, , , ,    celecoxib (CELEBREX) 200 MG capsule, , , ,    lactated ringers infusion, , Intravenous, Continuous, Hodierne, Adam, MD, Last Rate: 10 mL/hr at 10/03/24 0930, New Bag at 10/03/24 0930  ALLERGIES: Vicodin [hydrocodone-acetaminophen]  EXAM: Vitals: BP 131/74   Pulse 72   Temp (!) 97.2 F (36.2 C) (Oral)   Resp 18   Ht 5' 6 (1.676 m)   Wt 115.7 kg   SpO2 100%   BMI 41.16 kg/m   General Awake, at baseline alertness.   HEENT No scleral icterus or conjunctival hemorrhage. Globe position appears normal. External ears  normal. Nose patent without rhinorrhea. No lymphadenopathy. Large goiter.   Cardiovascular No cyanosis.  Pulmonary No audible stridor. Breathing easily with no labor.  Neuro Symmetric facial movement.   Psychiatry Appropriate  affect and mood.  Skin No scars or lesions on face or neck.  Extermities Moves all extremities with normal range of motion.   Other Findings None.   Assessment & Plan: Veronica Contreras has diagnoses of goiter and will go to the OR today for total thyroidectomy. Informed consent was obtained and available in EMR today. All questions have been answered, and risks/benefits/alternatives of procedure as noted in the consent were discussed in a quiet area. Questions were invited and answered. The patient expressed understanding, provided consent and wished to proceed despite risks.  Veronica Contreras Veronica Contreras 10/03/2024 10:41 AM

## 2024-10-03 NOTE — Op Note (Signed)
 Otolaryngology Operative note  Veronica Contreras Date/Time of Admission: 10/03/2024  8:50 AM  CSN: [id number];MRN:[id number]  DOB: 1976/03/21 Age: 48 y.o. Location: [facility] OR    Pre-Op Diagnosis: Multinodular substernal goiter with compressive symptoms   Post-Op Diagnosis: Same  Procedure: Total substernal thyroidectomy - CPT [phone]  Surgeon: Hadassah Parody, MD  Anesthesia type:  General  Anesthesiologist: Anesthesiologist: Lucious Debby BRAVO, MD CRNA: Bess Josette ORN, CRNA; Theophilus Burnet, Aloysius Pour, CRNA; Muqtasid, Lucie DASEN, CRNA   Staff: Circulator: Bari Birmingham, RN Physician Assistant: Palmer Purchase, PA-C Relief Circulator: Gwenn Mardy SQUIBB, RN Relief Scrub: Shona Greig DASEN, RN; Gail Geofm BRAVO Scrub Person: Orvil Asberry POUR  Implants: * No implants in log *  Specimens: ID Type Source Tests Collected by Time Destination  1 : total thyroid  stitch marks right lobe Tissue PATH ENT excision SURGICAL PATHOLOGY Aizlynn Digilio, Hadassah BROCKS, MD 10/03/2024 1437     EBL:  75 mL  Drains: JP x1  Post-op disposition and condition: PACU, hemodynamically stable   Findings: - Thyroid  noted to be extending substernal but was able to be removed through the neck  - Successful total thyroidectomy - Recurrent laryngeal nerve was identified, preserved, and noted to be stimulating appropriately bilaterally at the end of the case - All 4 parathyroid glands identified and preserved   Complications: None apparent  Indications and consent:  Veronica Contreras is a 49 y.o. female with diagnoses above. The patient's options were discussed, including risks/benefits/alternatives for each option. Patient expressed understanding, and despite these risks, consented and decided to proceed with above procedures. Informed consent was signed before proceeding.  Procedure: After being properly identified in the preoperative holding area, the patient was brought into the operating suite.  Patient was  placed supine on the operating table.  A pre-procedural time-out was performed. Pre-operative antibiotics and steroids were administered. After induction of general anesthesia, the patient was successfully intubated with a NIM endotracheal tube confirmed with CO2 return. Proper tube placement was confirmed. A shoulder roll was placed for appropriate neck extension given that the goiter was noted to extend substernally. The patient was prepped and draped in the usual fashion for this procedure.   Using a marking pen, a line was drawn horizontally in a natural neck crease. The skin and subcutaneous tissues were sharply dissected to the level of the platysma. The platysma itself was then incised and sub-platysmal flaps were elevated. The midline raphe between the strap muscles was identified. This was divided from the thyroid  cartilage to near the sternal notch. The strap muscles were retracted laterally exposing the right thyroid  lobe. The thyroid  lobe was retracted medially and the strap muscles overlying this lobe were carefully dissected free. Finger dissection was use to free up attachments laterally until the carotid was palpated. The superior pole was identified and with the thyroid  retracted inferiorly, the vessels were progressively ligated with 2-0 silk suture and hemo-clips. Care was taken to stay very near the gland to prevent injury to the superior laryngeal nerve. With the superior pole freed, attachments along the mid thyroid  were released and the lobe was retracted medially. Working lateral to the cricothyroid joint, the recurrent laryngeal nerve was identified. The nerve was stimulated and was noted to stimulate appropriately. The nerve was then dissecte along its course superiorly and inferiorly. Soft tissue attachments were lifted off the nerve further freeing the gland and allowing it to roll medially. The inferior pole was then controlled by identifying, and ligating inferior pole vessels using  2-0 silk  ties and hemo-clips. This allowed the inferior aspect to be delivered from underneath the sternum. Superior and inferior parathyroid  gland candidates were identified and preserved.   Attention was then turned to the left side. The thyroid  lobe was retracted medially and the strap muscles overlying this lobe were carefully dissected free. The superior pole was identified and with the thyroid  retracted inferiorly, the vessels were progressively ligated with 2-0 silk suture and hemo-clips. Care was taken to stay very near the gland to prevent injury to the superior laryngeal nerve. With the superior pole freed, attachments along the mid thyroid  were released and the lobe was retracted medially. Working lateral to the cricothyroid joint, the recurrent laryngeal nerve was identified. The nerve was stimulated and was noted to stimulate appropriately. The nerve was then dissected superiorly and inferiorly along its course. Soft tissue attachments were lifted off the nerve further freeing the gland and allowing it to roll medially. The inferior pole was then controlled by identifying, and ligating inferior pole vessels using 2-0 silk ties and hemo-clips. This allowed the inferior aspect of the thyroid  ot be delivered from underneath the sternum. Superior and inferior parathyroid  gland candidates were identified and preserved.   Finally, the gland was lifted off the trachea and berry's ligament was ligated and the isthmus was lifted off the trachea. The specimen was passed off the field as a permanent specimen. A stitch was used to mark the right thyroid  lobe.   Bipolar cautery was used for hemostasis. Hemostasis was then obtained, confirmed with valsalva, and the wound bed was copiously irrigated with saline. Additional hemostatic agent (surgicel snow) was used. A 19Fr round drain was inserted, brought through the skin and secured with a 2-0 silk suture. 3-0 vicryl interrupted sutures were use to close the  strap muscles. 3-0 vicryl interrupted sutures were used to close the platysma and inverted 4-0 vicryl sutures for the deep dermis in an interrupted fashion. This allowed the skin to be nicely re-approximated. Dermabond was used on the skin.   With the surgical portion of the procedure complete, all instrumentation was then removed from the operative field. The patient's skin was cleaned.  Patient was returned to the care of the anesthesia team.  Patient was then weaned from the anesthetic, extubated and transported to the PACU in stable condition.

## 2024-10-03 NOTE — Anesthesia Preprocedure Evaluation (Addendum)
 Anesthesia Evaluation  Patient identified by MRN, date of birth, ID band Patient awake    Reviewed: Allergy & Precautions, NPO status , Patient's Chart, lab work & pertinent test results  History of Anesthesia Complications Negative for: history of anesthetic complications  Airway Mallampati: II  TM Distance: >3 FB Neck ROM: Full    Dental  (+) Dental Advisory Given, Upper Dentures   Pulmonary Current Smoker and Patient abstained from smoking.   Pulmonary exam normal        Cardiovascular negative cardio ROS Normal cardiovascular exam     Neuro/Psych negative neurological ROS  negative psych ROS   GI/Hepatic ,GERD  Medicated and Controlled,,(+)     substance abuse  marijuana use  Endo/Other    Class 3 obesity  Renal/GU negative Renal ROS     Musculoskeletal  (+) Arthritis ,    Abdominal   Peds  Hematology negative hematology ROS (+)   Anesthesia Other Findings   Reproductive/Obstetrics                              Anesthesia Physical Anesthesia Plan  ASA: 3  Anesthesia Plan: General   Post-op Pain Management: Tylenol PO (pre-op)* and Celebrex PO (pre-op)*   Induction: Intravenous  PONV Risk Score and Plan: 2 and Treatment may vary due to age or medical condition, Ondansetron, Dexamethasone and Midazolam  Airway Management Planned: Oral ETT  Additional Equipment: None  Intra-op Plan:   Post-operative Plan: Extubation in OR  Informed Consent: I have reviewed the patients History and Physical, chart, labs and discussed the procedure including the risks, benefits and alternatives for the proposed anesthesia with the patient or authorized representative who has indicated his/her understanding and acceptance.     Dental advisory given  Plan Discussed with: CRNA and Anesthesiologist  Anesthesia Plan Comments:          Anesthesia Quick Evaluation

## 2024-10-03 NOTE — Anesthesia Procedure Notes (Signed)
 Procedure Name: Intubation Date/Time: 10/03/2024 11:19 AM  Performed by: Theophilus Burnet, Aloysius Pour, CRNAPre-anesthesia Checklist: Patient identified, Emergency Drugs available, Suction available and Patient being monitored Patient Re-evaluated:Patient Re-evaluated prior to induction Oxygen Delivery Method: Circle system utilized Preoxygenation: Pre-oxygenation with 100% oxygen Induction Type: IV induction Ventilation: Mask ventilation without difficulty Laryngoscope Size: Mac and 3 Grade View: Grade I Tube type: Oral (NIMS tube) Tube size: 7.0 mm Number of attempts: 1 Airway Equipment and Method: Stylet Placement Confirmation: ETT inserted through vocal cords under direct vision, positive ETCO2 and breath sounds checked- equal and bilateral Secured at: 23.5 cm Tube secured with: Tape Dental Injury: Teeth and Oropharynx as per pre-operative assessment

## 2024-10-04 ENCOUNTER — Encounter (HOSPITAL_COMMUNITY): Payer: Self-pay

## 2024-10-04 DIAGNOSIS — E042 Nontoxic multinodular goiter: Secondary | ICD-10-CM | POA: Diagnosis not present

## 2024-10-04 LAB — COMPREHENSIVE METABOLIC PANEL WITH GFR
ALT: 21 U/L (ref 0–44)
ALT: 22 U/L (ref 0–44)
ALT: 23 U/L (ref 0–44)
AST: 31 U/L (ref 15–41)
AST: 41 U/L (ref 15–41)
AST: 42 U/L — ABNORMAL HIGH (ref 15–41)
Albumin: 3.2 g/dL — ABNORMAL LOW (ref 3.5–5.0)
Albumin: 3.3 g/dL — ABNORMAL LOW (ref 3.5–5.0)
Albumin: 3.2 g/dL — ABNORMAL LOW (ref 3.5–5.0)
Alkaline Phosphatase: 59 U/L (ref 38–126)
Alkaline Phosphatase: 57 U/L (ref 38–126)
Alkaline Phosphatase: 54 U/L (ref 38–126)
Anion gap: 10 (ref 5–15)
Anion gap: 9 (ref 5–15)
Anion gap: 12 (ref 5–15)
BUN: 7 mg/dL (ref 6–20)
BUN: 7 mg/dL (ref 6–20)
BUN: 8 mg/dL (ref 6–20)
CO2: 22 mmol/L (ref 22–32)
CO2: 22 mmol/L (ref 22–32)
CO2: 23 mmol/L (ref 22–32)
Calcium: 8.3 mg/dL — ABNORMAL LOW (ref 8.9–10.3)
Calcium: 8.1 mg/dL — ABNORMAL LOW (ref 8.9–10.3)
Calcium: 8.2 mg/dL — ABNORMAL LOW (ref 8.9–10.3)
Chloride: 103 mmol/L (ref 98–111)
Chloride: 103 mmol/L (ref 98–111)
Chloride: 102 mmol/L (ref 98–111)
Creatinine, Ser: 0.64 mg/dL (ref 0.44–1.00)
Creatinine, Ser: 0.81 mg/dL (ref 0.44–1.00)
Creatinine, Ser: 0.75 mg/dL (ref 0.44–1.00)
GFR, Estimated: >60 mL/min (ref >60)
GFR, Estimated: >60 mL/min (ref >60)
GFR, Estimated: >60 mL/min (ref >60)
Glucose, Bld: 117 mg/dL — ABNORMAL HIGH (ref 70–99)
Glucose, Bld: 133 mg/dL — ABNORMAL HIGH (ref 70–99)
Glucose, Bld: 128 mg/dL — ABNORMAL HIGH (ref 70–99)
Potassium: 4.1 mmol/L (ref 3.5–5.1)
Potassium: 3.3 mmol/L — ABNORMAL LOW (ref 3.5–5.1)
Potassium: 3.1 mmol/L — ABNORMAL LOW (ref 3.5–5.1)
Sodium: 135 mmol/L (ref 135–145)
Sodium: 134 mmol/L — ABNORMAL LOW (ref 135–145)
Sodium: 137 mmol/L (ref 135–145)
Total Bilirubin: 0.9 mg/dL (ref 0.0–1.2)
Total Bilirubin: 0.8 mg/dL (ref 0.0–1.2)
Total Bilirubin: 0.7 mg/dL (ref 0.0–1.2)
Total Protein: 6.3 g/dL — ABNORMAL LOW (ref 6.5–8.1)
Total Protein: 6.3 g/dL — ABNORMAL LOW (ref 6.5–8.1)
Total Protein: 6.2 g/dL — ABNORMAL LOW (ref 6.5–8.1)

## 2024-10-04 LAB — CALCIUM, IONIZED: Calcium, Ionized, Serum: 4.9 mg/dL (ref 4.5–5.6)

## 2024-10-04 MED ORDER — CALCIUM CARBONATE ANTACID 500 MG PO CHEW
600.0000 mg | CHEWABLE_TABLET | Freq: Three times a day (TID) | ORAL | Status: DC
  Administered 2024-10-04 – 2024-10-05 (×3): 600 mg via ORAL
  Filled 2024-10-04 (×3): qty 3

## 2024-10-04 MED ORDER — CALCITRIOL 0.5 MCG PO CAPS
0.5000 ug | ORAL_CAPSULE | Freq: Two times a day (BID) | ORAL | Status: DC
  Administered 2024-10-04 – 2024-10-05 (×2): 0.5 ug via ORAL
  Filled 2024-10-04 (×2): qty 1

## 2024-10-04 NOTE — Progress Notes (Signed)
 ENT PROGRESS NOTE   Subjective: Patient seen and examined at bedside. No issues overnight.    Objective: Vital signs in last 24 hours: Temp:  [97.2 F (36.2 C)-99.1 F (37.3 C)] 98.2 F (36.8 C) (11/20 0809) Pulse Rate:  [72-97] 78 (11/20 0809) Resp:  [8-22] 17 (11/20 0809) BP: (111-158)/(63-89) 111/63 (11/20 0809) SpO2:  [94 %-100 %] 98 % (11/20 0809) Weight:  [115.7 kg] 115.7 kg (11/19 0910)  General appearance: alert and cooperative Neck: neck incision well approximated. No neck swelling. JP drain in place Mild dysphonia   11/20 Calcium  8.3 (corrected to 8.5) 11/19 calcium  8.5 Recent Labs    10/03/24 1911 10/03/24 1912 10/04/24 0405  NA 136  --  135  K 4.3  --  4.1  CL 103  --  103  CO2 19*  --  22  GLUCOSE 131*  --  117*  BUN 9  --  7  CREATININE 0.79 0.76 0.64  CALCIUM  8.5*  --  8.3*    Medications: I have reviewed the patient's current medications.  New Imaging: n/a  Pathology: n/a  Assessment/Plan: Veronica Contreras is a 48 y.o. female who is POD1 s/p total thyroidectomy.  - Discussed with pt her calcium  dropped slightly which is not unexpected. Started on tums and rocaltrol . Will repeat CMP around noon. Will keep her until calcium  stabilizes    LOS: 0 days    Hadassah Parody, MD [facility]  10/04/2024, 8:43 AM

## 2024-10-04 NOTE — Plan of Care (Signed)
   Problem: Education: Goal: Knowledge of General Education information will improve Description: Including pain rating scale, medication(s)/side effects and non-pharmacologic comfort measures Outcome: Progressing   Problem: Health Behavior/Discharge Planning: Goal: Ability to manage health-related needs will improve Outcome: Progressing   Problem: Clinical Measurements: Goal: Ability to maintain clinical measurements within normal limits will improve Outcome: Progressing Goal: Will remain free from infection Outcome: Progressing Goal: Diagnostic test results will improve Outcome: Progressing Goal: Respiratory complications will improve Outcome: Progressing Goal: Cardiovascular complication will be avoided Outcome: Progressing   Problem: Activity: Goal: Risk for activity intolerance will decrease Outcome: Progressing   Problem: Nutrition: Goal: Adequate nutrition will be maintained Outcome: Progressing   Problem: Coping: Goal: Level of anxiety will decrease Outcome: Progressing   Problem: Elimination: Goal: Will not experience complications related to bowel motility Outcome: Progressing Goal: Will not experience complications related to urinary retention Outcome: Progressing   Problem: Pain Managment: Goal: General experience of comfort will improve and/or be controlled Outcome: Progressing   Problem: Safety: Goal: Ability to remain free from injury will improve Outcome: Progressing   Problem: Skin Integrity: Goal: Risk for impaired skin integrity will decrease Outcome: Progressing   Problem: Education: Goal: Knowledge of the prescribed therapeutic regimen will improve Outcome: Progressing   Problem: Activity: Goal: Ability to tolerate increased activity will improve Outcome: Progressing   Problem: Health Behavior/Discharge Planning: Goal: Identification of resources available to assist in meeting health care needs will improve Outcome: Progressing    Problem: Nutrition: Goal: Maintenance of adequate nutrition will improve Outcome: Progressing   Problem: Clinical Measurements: Goal: Complications related to the disease process, condition or treatment will be avoided or minimized Outcome: Progressing   Problem: Respiratory: Goal: Will regain and/or maintain adequate ventilation Outcome: Progressing   Problem: Skin Integrity: Goal: Demonstration of wound healing without infection will improve Outcome: Progressing

## 2024-10-04 NOTE — Discharge Instructions (Addendum)
   Surgery Discharge Instructions:  Call clinic or return to ED if you: - develop a fever greater than 101.4 - have shaking chills or are feeling ill - become short of breath - have uncontrollable nausea or vomiting - can't hold down food or liquids or feel as though you are getting dehydrated - have leakage or drainage from wound - develop redness, pain at incision(s) or wound opens up/separates - any other acute events, problems, or concerns  Wound Care/Dressings/Drain Instructions:  - Your incision is closed with skin glue. This will slowly peel off over next couple of weeks.  - Empty and record drain output daily until removal on Monday - OK to shower from neck down (below the incision) until Monday. Once drain is removed, you can shower normally.   Medications: - Resume your regular home medications except as detailed in the medication reconciliation.  - For pain, take tylenol 650mg  every 4 hours and celebrex 200 mg every 12 hours If that is not sufficient, a stronger pain medication has been prescribed to you. Do not mix with any other narcotic medication. - Synthroid has been prescribed for thyroid  hormone replacement. At a later date we will measure your thyroid  hormone levels to see if the dose needs to be adjusted  - For your calcium , Take rocaltrol twice daily and TUMS as prescribed until follow-up   If you start to get numbness or tingling in your fingertips or lips take additional TUMS. If tingling continues despite taking TUMS, call the office or after hours number.   Follow Up:  - A follow-up appointment has been scheduled for drain removal on Monday.  Activity/Restrictions:  - Resume your regular activities, as tolerated. Avoid heavy lifting or straining (more than 10 lbs) for 2 weeks.  Diet: - Resume your regular diet, as tolerated  Additional Instructions: - Please take an over the counter stool softener while taking narcotic pain medication - DO NOT MIX  NARCOTIC PAIN MEDICATIONS OR TAKE NARCOTIC PRESCRIPTIONS AT THE SAME TIME - DO NOT DRIVE OR OPERATE HEAVY MACHINERY WHILE ON NARCOTICS  - DO NOT TAKE MORE THAN 4 GRAMS (4000mg ) OF TYLENOL (ACETAMINOPHEN) IN 24 HOURS

## 2024-10-04 NOTE — Anesthesia Postprocedure Evaluation (Signed)
 Anesthesia Post Note  Patient: Veronica Contreras  Procedure(s) Performed: THYROIDECTOMY, TOTAL (Bilateral: Throat)     Patient location during evaluation: PACU Anesthesia Type: General Level of consciousness: awake and alert Pain management: pain level controlled Vital Signs Assessment: post-procedure vital signs reviewed and stable Respiratory status: spontaneous breathing, nonlabored ventilation and respiratory function stable Cardiovascular status: stable and blood pressure returned to baseline Anesthetic complications: no   No notable events documented.  Last Vitals:  Vitals:   10/04/24 0450 10/04/24 0809  BP: 139/79 111/63  Pulse: 75 78  Resp: 18 17  Temp: 37.2 C 36.8 C  SpO2: 98% 98%    Last Pain:  Vitals:   10/04/24 0615  TempSrc:   PainSc: 0-No pain                 Debby FORBES Like

## 2024-10-05 DIAGNOSIS — E042 Nontoxic multinodular goiter: Secondary | ICD-10-CM | POA: Diagnosis not present

## 2024-10-05 LAB — PARATHYROID HORMONE, INTACT (NO CA): PTH: 8 pg/mL — ABNORMAL LOW (ref 15–65)

## 2024-10-05 MED ORDER — LEVOTHYROXINE SODIUM 100 MCG PO TABS: 100.0000 ug | ORAL_TABLET | Freq: Every day | ORAL | 2 refills | Status: AC

## 2024-10-05 MED ORDER — CALCITRIOL 0.5 MCG PO CAPS: 0.5000 ug | ORAL_CAPSULE | Freq: Two times a day (BID) | ORAL | 0 refills | Status: AC

## 2024-10-05 MED ORDER — ACETAMINOPHEN 325 MG PO TABS: 650.0000 mg | ORAL_TABLET | ORAL | Status: AC

## 2024-10-05 MED ORDER — CALCIUM CARBONATE ANTACID 500 MG PO CHEW: 2.0000 | CHEWABLE_TABLET | Freq: Three times a day (TID) | ORAL | Status: AC

## 2024-10-05 MED ORDER — OXYCODONE HCL 5 MG PO TABS: 5.0000 mg | ORAL_TABLET | Freq: Four times a day (QID) | ORAL | 0 refills | Status: DC | PRN

## 2024-10-05 MED ORDER — CELECOXIB 200 MG PO CAPS: 200.0000 mg | ORAL_CAPSULE | Freq: Two times a day (BID) | ORAL | 0 refills | Status: AC

## 2024-10-05 NOTE — Plan of Care (Signed)

## 2024-10-05 NOTE — Progress Notes (Signed)
 Discharge   Patient expressed verbal understanding of discharge POC.   Patient given time to ask any questions.  Additional education regarding JP drain included in AVS   Alert oriented in good spirits.   PIV removed.  Pressure dressings intact.  D/C to [facility].

## 2024-10-05 NOTE — Discharge Summary (Signed)
 Physician Discharge Summary  Patient ID: Veronica Contreras MRN: [id number] DOB/AGE: March 09, 1976 48 y.o.  Admit date: 10/03/2024 Discharge date: 10/05/2024  Admission Diagnoses:  Discharge Diagnoses:  Principal Problem:   Substernal thyroid  goiter   Discharged Condition: stable  Hospital Course:  Veronica Contreras is a 48 y.o. female with substernal thyroid  goiter who was taken to the OR on 10/03/24 for total thyroidectomy. She tolerated the procedure well and stayed in the hospital for calcium  monitoring. Calcium  initially dropped but has started to come back up and so is stable for discharge on current regiment TUMS 1000 mg TID and rocaltrol .  At the time of discharge pt was ambulating appropriately, voiding, tolerating PO, pain well controlled.   Will plan to see her at follow-up on 10/08/24. All questions were answered.   Consults: None  Significant Diagnostic Studies: labs: CMP, PTH  Treatments: analgesia: acetaminophen  and celebrex  and oxycodone  and surgery: total thyroidectomy   Discharge Exam: Blood pressure (!) 127/102, pulse 72, temperature 98.1 F (36.7 C), resp. rate 17, height 5' 6 (1.676 m), weight 115.7 kg, SpO2 95%. General appearance: alert and cooperative Neck: no swelling or concern for fluid accumulation Resp: normal work of breathing Incision/Wound: incision healing well, JP x1 with serosanguinous output.   Disposition: Discharge disposition: 01-Home or Self Care       Discharge Instructions     Diet - low sodium heart healthy   Complete by: As directed    Discharge wound care:   Complete by: As directed    Empty and record drain output until follow-up on Monday      Allergies as of 10/05/2024       Reactions   Vicodin [hydrocodone-acetaminophen ] Nausea And Vomiting        Medication List     STOP taking these medications    ibuprofen  200 MG tablet Commonly known as: ADVIL        TAKE these medications    acetaminophen  325 MG  tablet Commonly known as: TYLENOL  Take 2 tablets (650 mg total) by mouth every 4 (four) hours.   calcitRIOL  0.5 MCG capsule Commonly known as: ROCALTROL  Take 1 capsule (0.5 mcg total) by mouth 2 (two) times daily for 10 days.   calcium  carbonate 500 MG chewable tablet Commonly known as: Tums Chew 2 tablets (400 mg of elemental calcium  total) by mouth 3 (three) times daily.   celecoxib  200 MG capsule Commonly known as: CeleBREX  Take 1 capsule (200 mg total) by mouth 2 (two) times daily for 10 days.   cyanocobalamin  1000 MCG tablet Commonly known as: VITAMIN B12 Take 1 tablet (1,000 mcg total) by mouth daily.   LANSOPRAZOLE PO Take 1 capsule by mouth daily as needed (acid reducer).   levothyroxine  100 MCG tablet Commonly known as: SYNTHROID  Take 1 tablet (100 mcg total) by mouth daily at 6 (six) AM.   MULTIVITAMIN PO Take 1 tablet by mouth daily at 12 noon.   oxyCODONE  5 MG immediate release tablet Commonly known as: Oxy IR/ROXICODONE  Take 1 tablet (5 mg total) by mouth every 6 (six) hours as needed for severe pain (pain score 7-10).   rosuvastatin  10 MG tablet Commonly known as: CRESTOR  TAKE 1 TABLET BY MOUTH EVERY OTHER DAY   SKYRIZI (150 MG DOSE) [place] Inject 150 mg into the skin every 3 (three) months.   Vitamin D  (Ergocalciferol ) 1.25 MG (50000 UNIT) Caps capsule Commonly known as: DRISDOL  Take 1 capsule (50,000 Units total) by mouth every 7 (seven) days.  Discharge Care Instructions  (From admission, onward)           Start     Ordered   10/05/24 0000  Discharge wound care:       Comments: Empty and record drain output until follow-up on Monday   10/05/24 0802            Follow-up Information     Tysinger, Alm RAMAN, PA-C Follow up.   Specialty: Family Medicine Contact information: [address] [place] [address] [phone]                  Signed: Hadassah JAYSON Parody 10/05/2024, 8:03 AM

## 2024-10-08 ENCOUNTER — Encounter (INDEPENDENT_AMBULATORY_CARE_PROVIDER_SITE_OTHER): Payer: Self-pay

## 2024-10-08 ENCOUNTER — Ambulatory Visit (INDEPENDENT_AMBULATORY_CARE_PROVIDER_SITE_OTHER)

## 2024-10-08 VITALS — BP 136/86 | HR 74

## 2024-10-08 DIAGNOSIS — Z09 Encounter for follow-up examination after completed treatment for conditions other than malignant neoplasm: Secondary | ICD-10-CM

## 2024-10-08 DIAGNOSIS — E042 Nontoxic multinodular goiter: Secondary | ICD-10-CM

## 2024-10-08 DIAGNOSIS — E049 Nontoxic goiter, unspecified: Secondary | ICD-10-CM

## 2024-10-08 LAB — SURGICAL PATHOLOGY

## 2024-10-09 NOTE — Progress Notes (Signed)
 S: pt presents post-operatively s/p total thyroidectomy on 10/03/24.  Pt reports she has been doing well since with pain well controlled. She continues to use the TUMS and rocaltrol . Feels like voice is normal O: PTH 8, drain with ss drainage, removed, incision healing well  A: s/p total thyroidectomy on 10/03/24, doing well but with need for continued calcium  supplementation until PTH returns to normal  P:  - Return to clinic for f/u to discuss path as scheduled - Will get PTH at that time - Continue tums and roctalrol until PTH normalizes  - Call if any numbness or tingling or other concerns

## 2024-10-10 ENCOUNTER — Ambulatory Visit (INDEPENDENT_AMBULATORY_CARE_PROVIDER_SITE_OTHER): Admitting: Medical

## 2024-10-10 VITALS — BP 118/82 | HR 97 | Wt 253.4 lb

## 2024-10-10 DIAGNOSIS — Z9089 Acquired absence of other organs: Secondary | ICD-10-CM | POA: Insufficient documentation

## 2024-10-10 DIAGNOSIS — Z9889 Other specified postprocedural states: Secondary | ICD-10-CM | POA: Diagnosis not present

## 2024-10-10 DIAGNOSIS — R7309 Other abnormal glucose: Secondary | ICD-10-CM | POA: Diagnosis not present

## 2024-10-10 DIAGNOSIS — R935 Abnormal findings on diagnostic imaging of other abdominal regions, including retroperitoneum: Secondary | ICD-10-CM

## 2024-10-10 DIAGNOSIS — E890 Postprocedural hypothyroidism: Secondary | ICD-10-CM | POA: Diagnosis not present

## 2024-10-10 DIAGNOSIS — Q519 Congenital malformation of uterus and cervix, unspecified: Secondary | ICD-10-CM

## 2024-10-10 NOTE — Progress Notes (Signed)
 Subjective: Chief Complaint  Patient presents with   Follow-up    Follow-up on thyroid  removal that she had done last Wednesday    History of Present Illness Veronica Contreras is a 48 year old female who presents for follow-up after a total thyroidectomy.  She underwent a total thyroidectomy last Wednesday and had to stay an extra day due to concerns about her calcium  levels. She is currently taking levothyroxine  100 mcg daily, which she started the day after her surgery. She is also on calcitriol  0.5 mg twice daily for ten days, Tums three times a day, and vitamin D  50,000 IU weekly. She stopped taking oxycodone  after the first day post-surgery.  Recent blood work showed low potassium, low calcium , low albumin, and elevated glucose levels. She is concerned about the elevated glucose as she has not had issues with glucose levels before, and her A1c was normal in June. She has made dietary changes, including increased intake of green vegetables and lean meats, and has noticed some weight loss.   She experiences some soreness and tightness in her throat post-surgery, which affects her ability to swallow, but she manages by chewing more thoroughly. No tiredness, sluggishness, or recent fevers.  A CT scan of the abdomen, chest, and pelvis was performed last month and the patient was informed that there may be a fibroid in the uterus and an area in the left ovary that requires follow-up with gynecology. She plans to follow up with gynecology for further evaluation.  No other aggravating or relieving factors. No other complaint.   Past Medical History:  Diagnosis Date   Acid reflux    B12 deficiency 10/2020   Ectopic pregnancy    Foot fracture, right    prior   Goiter    Hyperthyroidism    Psoriasis    nails   Psoriatic arthritis (HCC) 2020   Vitamin D  deficiency    Current Outpatient Medications on File Prior to Visit  Medication Sig Dispense Refill   acetaminophen  (TYLENOL ) 325 MG  tablet Take 2 tablets (650 mg total) by mouth every 4 (four) hours.     calcitRIOL  (ROCALTROL ) 0.5 MCG capsule Take 1 capsule (0.5 mcg total) by mouth 2 (two) times daily for 10 days. 20 capsule 0   calcium  carbonate (TUMS) 500 MG chewable tablet Chew 2 tablets (400 mg of elemental calcium  total) by mouth 3 (three) times daily.     celecoxib  (CELEBREX ) 200 MG capsule Take 1 capsule (200 mg total) by mouth 2 (two) times daily for 10 days. 20 capsule 0   cyanocobalamin  (VITAMIN B12) 1000 MCG tablet Take 1 tablet (1,000 mcg total) by mouth daily. 90 tablet 2   LANSOPRAZOLE PO Take 1 capsule by mouth daily as needed (acid reducer).     levothyroxine  (SYNTHROID ) 100 MCG tablet Take 1 tablet (100 mcg total) by mouth daily at 6 (six) AM. 30 tablet 2   Risankizumab-rzaa (SKYRIZI, 150 MG DOSE, [place]) Inject 150 mg into the skin every 3 (three) months.     rosuvastatin  (CRESTOR ) 10 MG tablet TAKE 1 TABLET BY MOUTH EVERY OTHER DAY 45 tablet 1   Vitamin D , Ergocalciferol , (DRISDOL ) 1.25 MG (50000 UNIT) CAPS capsule Take 1 capsule (50,000 Units total) by mouth every 7 (seven) days. 12 capsule 3   Multiple Vitamin (MULTIVITAMIN PO) Take 1 tablet by mouth daily at 12 noon.     No current facility-administered medications on file prior to visit.   ROS as in subjective  Objective: BP 118/82   Pulse 97   Wt 253 lb 6.4 oz (114.9 kg)   SpO2 98%   BMI 40.90 kg/m   Gen: wd, wn, nad Lungs clear Heart rrr normal s1, s2, no murmurs Ext: no edema Pulses normal Neck with anterior surgical scar Voice normal   Assessment and Plan Encounter Diagnoses  Name Primary?   Post-surgical hypothyroidism Yes   S/P thyroidectomy    Hypocalcemia    Elevated glucose    Abnormal CT scan, pelvis    Uterine anomaly     Post-thyroidectomy state, surgery 10/03/24,  with postprocedural hypothyroidism and hypocalcemia Post-thyroidectomy hypothyroidism managed with levothyroxine . Hypocalcemia managed with calcium  and  calcitriol . Discussed lifelong levothyroxine  therapy and symptoms of dosing imbalance. - Continue levothyroxine  100 mcg daily on an empty stomach, 45-60 minutes before breakfast. - Continue calcium  supplements and calcitriol  as prescribed. - Monitor thyroid  function tests in 6-8 weeks. - Educated on symptoms of thyroid  hormone imbalance and when to seek medical attention.  Electrolyte abnormalities (hypokalemia, hypocalcemia) and abnormal glucose monitoring Electrolyte abnormalities likely related to thyroidectomy. Slight glucose elevation possibly due to post-surgical stress. Previous A1c normal. - Ordered electrolyte panel to check potassium, sodium, and calcium  levels. - Ordered A1c to assess glucose control. - Monitor glucose levels for any persistent abnormalities.  Suspected uterine fibroid and left ovarian lesion CT scan suggests possible uterine fibroid and left ovarian lesion. No prior fibroid history. Recommended gynecology evaluation. - Schedule follow-up with gynecology for evaluation of suspected uterine fibroid and left ovarian lesion.  Veronica Contreras was seen today for follow-up.  Diagnoses and all orders for this visit:  Post-surgical hypothyroidism -     Basic metabolic panel with GFR -     Parathyroid  hormone, intact (no Ca)  S/P thyroidectomy -     Basic metabolic panel with GFR -     Parathyroid  hormone, intact (no Ca)  Hypocalcemia -     Basic metabolic panel with GFR -     Parathyroid  hormone, intact (no Ca)  Elevated glucose -     Hemoglobin A1c  Abnormal CT scan, pelvis  Uterine anomaly    F/u pending labs

## 2024-10-11 ENCOUNTER — Ambulatory Visit: Payer: Self-pay | Admitting: Medical

## 2024-10-11 LAB — BASIC METABOLIC PANEL WITH GFR
BUN/Creatinine Ratio: 14 (ref 9–23)
BUN: 10 mg/dL (ref 6–24)
CO2: 25 mmol/L (ref 20–29)
Calcium: 10.9 mg/dL — ABNORMAL HIGH (ref 8.7–10.2)
Chloride: 98 mmol/L (ref 96–106)
Creatinine, Ser: 0.74 mg/dL (ref 0.57–1.00)
Glucose: 91 mg/dL (ref 70–99)
Potassium: 4.3 mmol/L (ref 3.5–5.2)
Sodium: 137 mmol/L (ref 134–144)
eGFR: 101 mL/min/1.73 (ref >59)

## 2024-10-11 LAB — HEMOGLOBIN A1C
Est. average glucose Bld gHb Est-mCnc: 108 mg/dL
Hgb A1c MFr Bld: 5.4 % (ref 4.8–5.6)

## 2024-10-11 LAB — PARATHYROID HORMONE, INTACT (NO CA): PTH: <6 pg/mL — AB (ref 15–65)

## 2024-10-11 NOTE — Progress Notes (Signed)
 Results thru my chart

## 2024-10-13 NOTE — Progress Notes (Signed)
 Results thru my chart

## 2024-10-18 ENCOUNTER — Ambulatory Visit (INDEPENDENT_AMBULATORY_CARE_PROVIDER_SITE_OTHER)

## 2024-10-18 ENCOUNTER — Encounter (INDEPENDENT_AMBULATORY_CARE_PROVIDER_SITE_OTHER): Payer: Self-pay

## 2024-10-18 VITALS — BP 128/78 | HR 67 | Temp 97.8°F

## 2024-10-18 DIAGNOSIS — E049 Nontoxic goiter, unspecified: Secondary | ICD-10-CM

## 2024-10-18 DIAGNOSIS — Z9089 Acquired absence of other organs: Secondary | ICD-10-CM

## 2024-10-18 DIAGNOSIS — Z9889 Other specified postprocedural states: Secondary | ICD-10-CM

## 2024-10-18 NOTE — Progress Notes (Signed)
 S: pt presents post-operatively s/p total thyroidectomy on 10/03/24.  No issues. Had calcium  measured at PCP and was high. Stopped calcium . PTH last week <6.    O: neck incision healing well  A: s/p total thyroidectomy on 10/03/24, doing well.  Stop calcium  and get PTH lab again next week. Likely PTH being suppressed by calcium  supplementation.  P:  - PTH next week - Stop calcium  - If numbness or tingling in fingertips or lips or other concerns restart TUMS and call.  More likely PTH suppressed by high calcium 

## 2024-10-26 ENCOUNTER — Ambulatory Visit (INDEPENDENT_AMBULATORY_CARE_PROVIDER_SITE_OTHER): Payer: Self-pay

## 2024-10-26 LAB — PTH, INTACT AND CALCIUM
Calcium: 9 mg/dL (ref 8.7–10.2)
PTH: 25 pg/mL (ref 15–65)

## 2024-11-06 ENCOUNTER — Other Ambulatory Visit: Payer: Self-pay | Admitting: Obstetrics and Gynecology

## 2024-11-06 DIAGNOSIS — N949 Unspecified condition associated with female genital organs and menstrual cycle: Secondary | ICD-10-CM

## 2024-11-22 ENCOUNTER — Other Ambulatory Visit (INDEPENDENT_AMBULATORY_CARE_PROVIDER_SITE_OTHER): Payer: Self-pay

## 2024-11-23 ENCOUNTER — Encounter: Payer: Self-pay | Admitting: Obstetrics and Gynecology

## 2024-11-28 ENCOUNTER — Telehealth (INDEPENDENT_AMBULATORY_CARE_PROVIDER_SITE_OTHER): Payer: Self-pay

## 2024-11-28 ENCOUNTER — Inpatient Hospital Stay
Admission: RE | Admit: 2024-11-28 | Discharge: 2024-11-28 | Attending: Obstetrics and Gynecology | Admitting: Obstetrics and Gynecology

## 2024-11-28 ENCOUNTER — Other Ambulatory Visit (HOSPITAL_COMMUNITY): Payer: Self-pay | Admitting: Obstetrics and Gynecology

## 2024-11-28 DIAGNOSIS — N949 Unspecified condition associated with female genital organs and menstrual cycle: Secondary | ICD-10-CM

## 2024-11-28 NOTE — Telephone Encounter (Signed)
 I received an email stating patient had reached out to [facility] Customer Service Team regarding a bill for date of service 09/13/2025.  That date of service was marked as DNBI as patient had to switch providers, no fault of hers, but rather a physician had left our ENT practice and patient had to be placed in the care of another provider.  Patient was seen for a pre-op appointment with Dr. Greggory on this date.  Patient was inquiring to the customer service representative as she was told she would not be charged for this visit.  Patient is correct.  Olam Shallow, ENT Practice Manager, tagged our Coder, Olam Rich, and asked her to please request a refund and reversal of this charge.    I called patient and advised her we would be doing a refund request and charge reversal.  Patient expressed gratitude and stated she had a great visit with Dr. Greggory and everything worked out well. I advised the patient to please give the refund and charge reversal a few weeks to clear and should she have any issues with it to please reach back out to me.  She thanked me and call was disconnected.

## 2024-12-05 ENCOUNTER — Ambulatory Visit (HOSPITAL_COMMUNITY)
Admission: RE | Admit: 2024-12-05 | Discharge: 2024-12-05 | Disposition: A | Discharge status: Home / Self Care | Source: Ambulatory Visit | Attending: Obstetrics and Gynecology | Admitting: Obstetrics and Gynecology

## 2024-12-05 DIAGNOSIS — N949 Unspecified condition associated with female genital organs and menstrual cycle: Secondary | ICD-10-CM | POA: Insufficient documentation

## 2024-12-05 MED ORDER — GADOBUTROL 1 MMOL/ML IV SOLN
10.0000 mL | Freq: Once | INTRAVENOUS | Status: AC | PRN
  Administered 2024-12-05: 10 mL via INTRAVENOUS

## 2025-04-24 ENCOUNTER — Encounter: Payer: Self-pay | Admitting: Medical
